# Patient Record
Sex: Male | Born: 1945 | Race: White | Hispanic: No | Marital: Married | Smoking: Former smoker
Health system: Southern US, Community
[De-identification: ages and names within clinical notes are randomized; demographics above are authoritative.]

## PROBLEM LIST (undated history)

## (undated) DIAGNOSIS — Z9889 Other specified postprocedural states: Secondary | ICD-10-CM

## (undated) DIAGNOSIS — E119 Type 2 diabetes mellitus without complications: Secondary | ICD-10-CM

## (undated) DIAGNOSIS — R351 Nocturia: Secondary | ICD-10-CM

## (undated) DIAGNOSIS — M1990 Unspecified osteoarthritis, unspecified site: Secondary | ICD-10-CM

## (undated) DIAGNOSIS — I2510 Atherosclerotic heart disease of native coronary artery without angina pectoris: Secondary | ICD-10-CM

## (undated) DIAGNOSIS — Z85819 Personal history of malignant neoplasm of unspecified site of lip, oral cavity, and pharynx: Secondary | ICD-10-CM

## (undated) DIAGNOSIS — E785 Hyperlipidemia, unspecified: Secondary | ICD-10-CM

## (undated) DIAGNOSIS — E559 Vitamin D deficiency, unspecified: Secondary | ICD-10-CM

## (undated) DIAGNOSIS — T8859XA Other complications of anesthesia, initial encounter: Secondary | ICD-10-CM

## (undated) DIAGNOSIS — T4145XA Adverse effect of unspecified anesthetic, initial encounter: Secondary | ICD-10-CM

## (undated) DIAGNOSIS — C61 Malignant neoplasm of prostate: Secondary | ICD-10-CM

## (undated) DIAGNOSIS — Z85828 Personal history of other malignant neoplasm of skin: Secondary | ICD-10-CM

## (undated) HISTORY — PX: TONSILLECTOMY: SUR1361

## (undated) HISTORY — DX: Type 2 diabetes mellitus without complications: E11.9

## (undated) HISTORY — DX: Vitamin D deficiency, unspecified: E55.9

## (undated) HISTORY — PX: HERNIA REPAIR: SHX51

## (undated) HISTORY — DX: Atherosclerotic heart disease of native coronary artery without angina pectoris: I25.10

## (undated) HISTORY — PX: COLONOSCOPY W/ BIOPSIES AND POLYPECTOMY: SHX1376

---

## 2009-12-22 HISTORY — PX: OTHER SURGICAL HISTORY: SHX169

## 2014-03-15 ENCOUNTER — Other Ambulatory Visit: Payer: Self-pay | Admitting: Neurosurgery

## 2014-03-27 ENCOUNTER — Encounter (HOSPITAL_COMMUNITY): Payer: Self-pay

## 2014-03-27 ENCOUNTER — Encounter (HOSPITAL_COMMUNITY)
Admission: RE | Admit: 2014-03-27 | Discharge: 2014-03-27 | Disposition: A | Discharge status: Home / Self Care | Payer: BC Managed Care – PPO | Source: Ambulatory Visit | Attending: Neurosurgery | Admitting: Neurosurgery

## 2014-03-27 DIAGNOSIS — Z01812 Encounter for preprocedural laboratory examination: Secondary | ICD-10-CM | POA: Insufficient documentation

## 2014-03-27 HISTORY — DX: Hyperlipidemia, unspecified: E78.5

## 2014-03-27 HISTORY — DX: Malignant neoplasm of prostate: C61

## 2014-03-27 HISTORY — DX: Unspecified osteoarthritis, unspecified site: M19.90

## 2014-03-27 HISTORY — DX: Adverse effect of unspecified anesthetic, initial encounter: T41.45XA

## 2014-03-27 HISTORY — DX: Other complications of anesthesia, initial encounter: T88.59XA

## 2014-03-27 LAB — BASIC METABOLIC PANEL
BUN: 15 mg/dL (ref 6–23)
CALCIUM: 9.3 mg/dL (ref 8.4–10.5)
CHLORIDE: 102 meq/L (ref 96–112)
CO2: 25 meq/L (ref 19–32)
Creatinine, Ser: 0.71 mg/dL (ref 0.50–1.35)
GFR calc Af Amer: >90 mL/min (ref >90)
GFR calc non Af Amer: >90 mL/min (ref >90)
Glucose, Bld: 99 mg/dL (ref 70–99)
Potassium: 4.1 mEq/L (ref 3.7–5.3)
SODIUM: 140 meq/L (ref 137–147)

## 2014-03-27 LAB — CBC
HCT: 41.2 % (ref 39.0–52.0)
Hemoglobin: 14.6 g/dL (ref 13.0–17.0)
MCH: 33.3 pg (ref 26.0–34.0)
MCHC: 35.4 g/dL (ref 30.0–36.0)
MCV: 93.8 fL (ref 78.0–100.0)
Platelets: 182 K/uL (ref 150–400)
RBC: 4.39 MIL/uL (ref 4.22–5.81)
RDW: 13.8 % (ref 11.5–15.5)
WBC: 4 K/uL (ref 4.0–10.5)

## 2014-03-27 LAB — SURGICAL PCR SCREEN
MRSA, PCR: NEGATIVE
STAPHYLOCOCCUS AUREUS: NEGATIVE

## 2014-03-27 NOTE — Pre-Procedure Instructions (Signed)
[name]  Your procedure is scheduled on: Wednesday, April 15.  Report to [facility], Main Entrance / Entrance "A" at 6:30AM.  Call this number if you have problems the morning of surgery: [phone]   Remember:   Do not eat food or drink liquids after midnight Tuesday, April 14.   Take these medicines the morning of surgery with A SIP OF WATER: Take if needed:acetaminophen (TYLENOL).               Stop taking Aspirin, Coumadin, Plavix, Effient and Herbal medications.  Do not take any NSAIDs ie: Ibuprofen,  Advil,meloxicam (MOBIC),Naproxen or any medication containing Aspirin.   Do not wear jewelry, make-up or nail polish.  Do not wear lotions, powders, or perfumes. You may wear deodorant.             Men may shave face and neck.  Do not bring valuables to the hospital.  [facility] is not responsible for any belongings or valuables.               Contacts, dentures or bridgework may not be worn into surgery.  Leave suitcase in the car. After surgery it may be brought to your room.  For patients admitted to the hospital, discharge time is determined by your  treatment team.               Patients discharged the day of surgery will not be allowed to drive home.  Name and phone number of your driver: -   Special Instructions: -   Please read over the following fact sheets that you were given: Pain Booklet, Coughing and Deep Breathing and Surgical Site Infection Prevention

## 2014-03-27 NOTE — Pre-Procedure Instructions (Signed)
David Briggs  Your procedure is scheduled on: Thursday, April 16.  Report to [facility], Main Entrance / Entrance "A" at 5:30AM.  Call this number if you have problems the morning of surgery: [phone]   Remember:   Do not eat food or drink liquids after midnight Wednesday, April 15.   Take these medicines the morning of surgery with A SIP OF WATER: Take if needed:acetaminophen (TYLENOL).               Stop taking Aspirin, Coumadin, Plavix, Effient and Herbal medications.  Do not take any NSAIDs ie: Ibuprofen,  Advil,meloxicam (MOBIC),Naproxen or any medication containing Aspirin.   Do not wear jewelry, make-up or nail polish.  Do not wear lotions, powders, or perfumes. You may wear deodorant.             Men may shave face and neck.  Do not bring valuables to the hospital.  [facility] is not responsible for any belongings or valuables.               Contacts, dentures or bridgework may not be worn into surgery.  Leave suitcase in the car. After surgery it may be brought to your room.  For patients admitted to the hospital, discharge time is determined by your  treatment team.               Patients discharged the day of surgery will not be allowed to drive home.  Name and phone number of your driver: -   Special Instructions: -   Please read over the following fact sheets that you were given: Pain Booklet, Coughing and Deep Breathing and Surgical Site Infection Prevention

## 2014-04-04 MED ORDER — CEFAZOLIN SODIUM-DEXTROSE 2-3 GM-% IV SOLR
2.0000 g | INTRAVENOUS | Status: AC
  Administered 2014-04-05: 2 g via INTRAVENOUS
  Filled 2014-04-04: qty 50

## 2014-04-05 ENCOUNTER — Ambulatory Visit (HOSPITAL_COMMUNITY): Payer: BC Managed Care – PPO | Admitting: Anesthesiology

## 2014-04-05 ENCOUNTER — Encounter (HOSPITAL_COMMUNITY)
Admission: RE | Disposition: A | Discharge status: Home / Self Care | Payer: Self-pay | Source: Ambulatory Visit | Attending: Neurosurgery

## 2014-04-05 ENCOUNTER — Observation Stay (HOSPITAL_COMMUNITY)
Admission: RE | Admit: 2014-04-05 | Discharge: 2014-04-06 | Disposition: A | Discharge status: Home / Self Care | Payer: BC Managed Care – PPO | Source: Ambulatory Visit | Attending: Neurosurgery | Admitting: Neurosurgery

## 2014-04-05 ENCOUNTER — Ambulatory Visit (HOSPITAL_COMMUNITY): Payer: BC Managed Care – PPO

## 2014-04-05 ENCOUNTER — Encounter (HOSPITAL_COMMUNITY): Payer: BC Managed Care – PPO | Admitting: Anesthesiology

## 2014-04-05 ENCOUNTER — Encounter (HOSPITAL_COMMUNITY): Payer: Self-pay | Admitting: *Deleted

## 2014-04-05 DIAGNOSIS — Z85819 Personal history of malignant neoplasm of unspecified site of lip, oral cavity, and pharynx: Secondary | ICD-10-CM | POA: Insufficient documentation

## 2014-04-05 DIAGNOSIS — M51379 Other intervertebral disc degeneration, lumbosacral region without mention of lumbar back pain or lower extremity pain: Secondary | ICD-10-CM | POA: Insufficient documentation

## 2014-04-05 DIAGNOSIS — C61 Malignant neoplasm of prostate: Secondary | ICD-10-CM | POA: Insufficient documentation

## 2014-04-05 DIAGNOSIS — E7800 Pure hypercholesterolemia, unspecified: Secondary | ICD-10-CM | POA: Insufficient documentation

## 2014-04-05 DIAGNOSIS — M5137 Other intervertebral disc degeneration, lumbosacral region: Secondary | ICD-10-CM | POA: Insufficient documentation

## 2014-04-05 DIAGNOSIS — M5126 Other intervertebral disc displacement, lumbar region: Principal | ICD-10-CM | POA: Insufficient documentation

## 2014-04-05 DIAGNOSIS — M47817 Spondylosis without myelopathy or radiculopathy, lumbosacral region: Secondary | ICD-10-CM | POA: Insufficient documentation

## 2014-04-05 HISTORY — PX: LUMBAR LAMINECTOMY/DECOMPRESSION MICRODISCECTOMY: SHX5026

## 2014-04-05 SURGERY — LUMBAR LAMINECTOMY/DECOMPRESSION MICRODISCECTOMY 1 LEVEL
Anesthesia: General | Laterality: Right

## 2014-04-05 MED ORDER — ACETAMINOPHEN 325 MG PO TABS: 650.0000 mg | ORAL_TABLET | ORAL | Status: DC | PRN

## 2014-04-05 MED ORDER — SENNA 8.6 MG PO TABS
1.0000 | ORAL_TABLET | Freq: Two times a day (BID) | ORAL | Status: DC
  Administered 2014-04-05: 8.6 mg via ORAL
  Filled 2014-04-05 (×2): qty 1

## 2014-04-05 MED ORDER — FENTANYL CITRATE 0.05 MG/ML IJ SOLN
INTRAMUSCULAR | Status: AC
  Filled 2014-04-05: qty 5

## 2014-04-05 MED ORDER — LIDOCAINE HCL (CARDIAC) 20 MG/ML IV SOLN
INTRAVENOUS | Status: DC | PRN
  Administered 2014-04-05: 40 mg via INTRAVENOUS

## 2014-04-05 MED ORDER — ONDANSETRON HCL 4 MG/2ML IJ SOLN
INTRAMUSCULAR | Status: DC | PRN
  Administered 2014-04-05: 4 mg via INTRAVENOUS

## 2014-04-05 MED ORDER — SIMVASTATIN 40 MG PO TABS
40.0000 mg | ORAL_TABLET | Freq: Every day | ORAL | Status: DC
  Administered 2014-04-05: 40 mg via ORAL
  Filled 2014-04-05 (×2): qty 1

## 2014-04-05 MED ORDER — POLYETHYLENE GLYCOL 3350 17 G PO PACK
17.0000 g | PACK | Freq: Every day | ORAL | Status: DC | PRN
Start: 2014-04-05 — End: 2014-04-06
  Filled 2014-04-05: qty 1

## 2014-04-05 MED ORDER — METHYLPREDNISOLONE ACETATE 80 MG/ML IJ SUSP
INTRAMUSCULAR | Status: DC | PRN
  Administered 2014-04-05: 80 mg

## 2014-04-05 MED ORDER — BUPIVACAINE HCL (PF) 0.5 % IJ SOLN
INTRAMUSCULAR | Status: DC | PRN
  Administered 2014-04-05: 8 mL

## 2014-04-05 MED ORDER — MORPHINE SULFATE 2 MG/ML IJ SOLN
1.0000 mg | INTRAMUSCULAR | Status: DC | PRN
  Administered 2014-04-05: 2 mg via INTRAVENOUS
  Filled 2014-04-05: qty 1

## 2014-04-05 MED ORDER — KCL IN DEXTROSE-NACL 20-5-0.45 MEQ/L-%-% IV SOLN
INTRAVENOUS | Status: DC
  Administered 2014-04-05: 19:00:00 via INTRAVENOUS
  Filled 2014-04-05 (×3): qty 1000

## 2014-04-05 MED ORDER — OXYCODONE-ACETAMINOPHEN 5-325 MG PO TABS: 1.0000 | ORAL_TABLET | ORAL | Status: DC | PRN

## 2014-04-05 MED ORDER — LIDOCAINE-EPINEPHRINE 1 %-1:100000 IJ SOLN
INTRAMUSCULAR | Status: DC | PRN
  Administered 2014-04-05: 8 mL

## 2014-04-05 MED ORDER — PHENOL 1.4 % MT LIQD: 1.0000 | OROMUCOSAL | Status: DC | PRN

## 2014-04-05 MED ORDER — SODIUM CHLORIDE 0.9 % IJ SOLN: 3.0000 mL | INTRAMUSCULAR | Status: DC | PRN

## 2014-04-05 MED ORDER — NEOSTIGMINE METHYLSULFATE 1 MG/ML IJ SOLN
INTRAMUSCULAR | Status: AC
  Filled 2014-04-05: qty 10

## 2014-04-05 MED ORDER — DIAZEPAM 5 MG PO TABS: 5.0000 mg | ORAL_TABLET | Freq: Four times a day (QID) | ORAL | Status: DC | PRN

## 2014-04-05 MED ORDER — PANTOPRAZOLE SODIUM 40 MG IV SOLR
40.0000 mg | Freq: Every day | INTRAVENOUS | Status: DC
  Filled 2014-04-05: qty 40

## 2014-04-05 MED ORDER — EPHEDRINE SULFATE 50 MG/ML IJ SOLN
INTRAMUSCULAR | Status: AC
  Filled 2014-04-05: qty 1

## 2014-04-05 MED ORDER — BISACODYL 10 MG RE SUPP: 10.0000 mg | Freq: Every day | RECTAL | Status: DC | PRN

## 2014-04-05 MED ORDER — FENTANYL CITRATE 0.05 MG/ML IJ SOLN
INTRAMUSCULAR | Status: DC | PRN
Start: 2014-04-05 — End: 2014-04-05
  Administered 2014-04-05: 100 ug via INTRAVENOUS
  Administered 2014-04-05: 50 ug via INTRAVENOUS

## 2014-04-05 MED ORDER — HYDROCODONE-ACETAMINOPHEN 5-325 MG PO TABS
1.0000 | ORAL_TABLET | ORAL | Status: DC | PRN
  Administered 2014-04-05: 1 via ORAL
  Filled 2014-04-05: qty 1

## 2014-04-05 MED ORDER — NEOSTIGMINE METHYLSULFATE 1 MG/ML IJ SOLN
INTRAMUSCULAR | Status: DC | PRN
  Administered 2014-04-05: 3 mg via INTRAVENOUS

## 2014-04-05 MED ORDER — SODIUM CHLORIDE 0.9 % IJ SOLN
INTRAMUSCULAR | Status: AC
  Filled 2014-04-05: qty 10

## 2014-04-05 MED ORDER — SUCCINYLCHOLINE CHLORIDE 20 MG/ML IJ SOLN
INTRAMUSCULAR | Status: AC
  Filled 2014-04-05: qty 1

## 2014-04-05 MED ORDER — ALUM & MAG HYDROXIDE-SIMETH 200-200-20 MG/5ML PO SUSP: 30.0000 mL | Freq: Four times a day (QID) | ORAL | Status: DC | PRN

## 2014-04-05 MED ORDER — ACETAMINOPHEN 650 MG RE SUPP: 650.0000 mg | RECTAL | Status: DC | PRN

## 2014-04-05 MED ORDER — MIDAZOLAM HCL 2 MG/2ML IJ SOLN
INTRAMUSCULAR | Status: AC
  Filled 2014-04-05: qty 2

## 2014-04-05 MED ORDER — GLYCOPYRROLATE 0.2 MG/ML IJ SOLN
INTRAMUSCULAR | Status: AC
  Filled 2014-04-05: qty 2

## 2014-04-05 MED ORDER — ONDANSETRON HCL 4 MG/2ML IJ SOLN: 4.0000 mg | Freq: Once | INTRAMUSCULAR | Status: DC | PRN

## 2014-04-05 MED ORDER — ROCURONIUM BROMIDE 50 MG/5ML IV SOLN
INTRAVENOUS | Status: AC
  Filled 2014-04-05: qty 1

## 2014-04-05 MED ORDER — SODIUM CHLORIDE 0.9 % IJ SOLN
3.0000 mL | Freq: Two times a day (BID) | INTRAMUSCULAR | Status: DC
  Administered 2014-04-05 (×2): 3 mL via INTRAVENOUS

## 2014-04-05 MED ORDER — ONDANSETRON HCL 4 MG/2ML IJ SOLN
INTRAMUSCULAR | Status: AC
  Filled 2014-04-05: qty 2

## 2014-04-05 MED ORDER — DOCUSATE SODIUM 100 MG PO CAPS
100.0000 mg | ORAL_CAPSULE | Freq: Two times a day (BID) | ORAL | Status: DC
  Administered 2014-04-05 (×2): 100 mg via ORAL
  Filled 2014-04-05 (×4): qty 1

## 2014-04-05 MED ORDER — MENTHOL 3 MG MT LOZG
1.0000 | LOZENGE | OROMUCOSAL | Status: DC | PRN
Start: 2014-04-05 — End: 2014-04-06

## 2014-04-05 MED ORDER — THROMBIN 5000 UNITS EX SOLR
CUTANEOUS | Status: DC | PRN
Start: 2014-04-05 — End: 2014-04-05
  Administered 2014-04-05 (×2): 5000 [IU] via TOPICAL

## 2014-04-05 MED ORDER — LIDOCAINE HCL (CARDIAC) 20 MG/ML IV SOLN
INTRAVENOUS | Status: AC
  Filled 2014-04-05: qty 5

## 2014-04-05 MED ORDER — PANTOPRAZOLE SODIUM 40 MG PO TBEC
40.0000 mg | DELAYED_RELEASE_TABLET | Freq: Every day | ORAL | Status: DC
  Administered 2014-04-05: 40 mg via ORAL
  Filled 2014-04-05: qty 1

## 2014-04-05 MED ORDER — 0.9 % SODIUM CHLORIDE (POUR BTL) OPTIME
TOPICAL | Status: DC | PRN
  Administered 2014-04-05: 1000 mL

## 2014-04-05 MED ORDER — LACTATED RINGERS IV SOLN
INTRAVENOUS | Status: DC | PRN
  Administered 2014-04-05 (×2): via INTRAVENOUS

## 2014-04-05 MED ORDER — EPHEDRINE SULFATE 50 MG/ML IJ SOLN
INTRAMUSCULAR | Status: DC | PRN
  Administered 2014-04-05 (×3): 10 mg via INTRAVENOUS
  Administered 2014-04-05: 5 mg via INTRAVENOUS

## 2014-04-05 MED ORDER — PROMETHAZINE HCL 25 MG PO TABS: 25.0000 mg | ORAL_TABLET | Freq: Four times a day (QID) | ORAL | Status: DC | PRN

## 2014-04-05 MED ORDER — HYDROMORPHONE HCL PF 1 MG/ML IJ SOLN
INTRAMUSCULAR | Status: AC
  Filled 2014-04-05: qty 1

## 2014-04-05 MED ORDER — GLYCOPYRROLATE 0.2 MG/ML IJ SOLN
INTRAMUSCULAR | Status: DC | PRN
  Administered 2014-04-05: 0.4 mg via INTRAVENOUS

## 2014-04-05 MED ORDER — PROMETHAZINE HCL 25 MG RE SUPP: 25.0000 mg | Freq: Four times a day (QID) | RECTAL | Status: DC | PRN

## 2014-04-05 MED ORDER — HEMOSTATIC AGENTS (NO CHARGE) OPTIME
TOPICAL | Status: DC | PRN
  Administered 2014-04-05: 1 via TOPICAL

## 2014-04-05 MED ORDER — PROMETHAZINE HCL 25 MG/ML IJ SOLN
12.5000 mg | Freq: Four times a day (QID) | INTRAMUSCULAR | Status: DC | PRN
  Administered 2014-04-05: 12.5 mg via INTRAVENOUS
  Filled 2014-04-05: qty 1

## 2014-04-05 MED ORDER — ONDANSETRON HCL 4 MG/2ML IJ SOLN
4.0000 mg | INTRAMUSCULAR | Status: DC | PRN
  Administered 2014-04-05: 4 mg via INTRAVENOUS
  Filled 2014-04-05: qty 2

## 2014-04-05 MED ORDER — PROPOFOL 10 MG/ML IV BOLUS
INTRAVENOUS | Status: AC
  Filled 2014-04-05: qty 20

## 2014-04-05 MED ORDER — CEFAZOLIN SODIUM 1-5 GM-% IV SOLN
1.0000 g | Freq: Three times a day (TID) | INTRAVENOUS | Status: AC
  Administered 2014-04-05 (×2): 1 g via INTRAVENOUS
  Filled 2014-04-05 (×2): qty 50

## 2014-04-05 MED ORDER — ROCURONIUM BROMIDE 100 MG/10ML IV SOLN
INTRAVENOUS | Status: DC | PRN
  Administered 2014-04-05: 50 mg via INTRAVENOUS

## 2014-04-05 MED ORDER — HYDROMORPHONE HCL PF 1 MG/ML IJ SOLN
0.2500 mg | INTRAMUSCULAR | Status: DC | PRN
  Administered 2014-04-05 (×2): 0.5 mg via INTRAVENOUS

## 2014-04-05 MED ORDER — FENTANYL CITRATE 0.05 MG/ML IJ SOLN
INTRAMUSCULAR | Status: AC
  Filled 2014-04-05: qty 2

## 2014-04-05 MED ORDER — MELOXICAM 7.5 MG PO TABS
7.5000 mg | ORAL_TABLET | Freq: Two times a day (BID) | ORAL | Status: DC
  Administered 2014-04-05 (×2): 7.5 mg via ORAL
  Filled 2014-04-05 (×4): qty 1

## 2014-04-05 MED ORDER — ACETAMINOPHEN 500 MG PO TABS: 500.0000 mg | ORAL_TABLET | Freq: Every day | ORAL | Status: DC | PRN

## 2014-04-05 MED ORDER — FLEET ENEMA 7-19 GM/118ML RE ENEM
1.0000 | ENEMA | Freq: Once | RECTAL | Status: AC | PRN
  Filled 2014-04-05: qty 1

## 2014-04-05 MED ORDER — PROPOFOL 10 MG/ML IV BOLUS
INTRAVENOUS | Status: DC | PRN
  Administered 2014-04-05: 170 mg via INTRAVENOUS

## 2014-04-05 SURGICAL SUPPLY — 62 items
BAG DECANTER FOR FLEXI CONT (MISCELLANEOUS) ×3 IMPLANT
BENZOIN TINCTURE PRP APPL 2/3 (GAUZE/BANDAGES/DRESSINGS) ×3 IMPLANT
BIT DRILL NEURO 2X3.1 SFT TUCH (MISCELLANEOUS) ×1 IMPLANT
BLADE SURG ROTATE 9660 (MISCELLANEOUS) IMPLANT
BUR ROUND FLUTED 5 RND (BURR) ×2 IMPLANT
BUR ROUND FLUTED 5MM RND (BURR) ×1
CANISTER SUCT 3000ML (MISCELLANEOUS) ×3 IMPLANT
CLOSURE WOUND 1/2 X4 (GAUZE/BANDAGES/DRESSINGS)
CONT SPEC 4OZ CLIKSEAL STRL BL (MISCELLANEOUS) ×3 IMPLANT
DERMABOND ADVANCED (GAUZE/BANDAGES/DRESSINGS) ×2
DERMABOND ADVANCED .7 DNX12 (GAUZE/BANDAGES/DRESSINGS) ×1 IMPLANT
DRAPE LAPAROTOMY 100X72X124 (DRAPES) ×3 IMPLANT
DRAPE MICROSCOPE LEICA (MISCELLANEOUS) ×3 IMPLANT
DRAPE POUCH INSTRU U-SHP 10X18 (DRAPES) ×3 IMPLANT
DRAPE SURG 17X23 STRL (DRAPES) ×3 IMPLANT
DRESSING TELFA 8X3 (GAUZE/BANDAGES/DRESSINGS) IMPLANT
DRILL NEURO 2X3.1 SOFT TOUCH (MISCELLANEOUS) ×3
DURAPREP 26ML APPLICATOR (WOUND CARE) ×3 IMPLANT
ELECT REM PT RETURN 9FT ADLT (ELECTROSURGICAL) ×3
ELECTRODE REM PT RTRN 9FT ADLT (ELECTROSURGICAL) ×1 IMPLANT
GAUZE SPONGE 4X4 16PLY XRAY LF (GAUZE/BANDAGES/DRESSINGS) IMPLANT
GLOVE BIO SURGEON STRL SZ8 (GLOVE) ×3 IMPLANT
GLOVE BIOGEL PI IND STRL 8 (GLOVE) ×1 IMPLANT
GLOVE BIOGEL PI IND STRL 8.5 (GLOVE) ×1 IMPLANT
GLOVE BIOGEL PI INDICATOR 8 (GLOVE) ×2
GLOVE BIOGEL PI INDICATOR 8.5 (GLOVE) ×2
GLOVE ECLIPSE 8.0 STRL XLNG CF (GLOVE) ×3 IMPLANT
GLOVE EXAM NITRILE LRG STRL (GLOVE) IMPLANT
GLOVE EXAM NITRILE MD LF STRL (GLOVE) IMPLANT
GLOVE EXAM NITRILE XL STR (GLOVE) IMPLANT
GLOVE EXAM NITRILE XS STR PU (GLOVE) IMPLANT
GLOVE INDICATOR 7.5 STRL GRN (GLOVE) ×3 IMPLANT
GLOVE SS BIOGEL STRL SZ 7 (GLOVE) ×2 IMPLANT
GLOVE SUPERSENSE BIOGEL SZ 7 (GLOVE) ×4
GOWN BRE IMP SLV AUR LG STRL (GOWN DISPOSABLE) IMPLANT
GOWN BRE IMP SLV AUR XL STRL (GOWN DISPOSABLE) IMPLANT
GOWN SPEC L3 XXLG W/TWL (GOWN DISPOSABLE) ×3 IMPLANT
GOWN STRL REIN 2XL LVL4 (GOWN DISPOSABLE) IMPLANT
GOWN STRL REUS W/ TWL LRG LVL3 (GOWN DISPOSABLE) ×2 IMPLANT
GOWN STRL REUS W/ TWL XL LVL3 (GOWN DISPOSABLE) ×1 IMPLANT
GOWN STRL REUS W/TWL LRG LVL3 (GOWN DISPOSABLE) ×4
GOWN STRL REUS W/TWL XL LVL3 (GOWN DISPOSABLE) ×2
KIT BASIN OR (CUSTOM PROCEDURE TRAY) ×3 IMPLANT
KIT ROOM TURNOVER OR (KITS) ×3 IMPLANT
NEEDLE HYPO 18GX1.5 BLUNT FILL (NEEDLE) IMPLANT
NEEDLE HYPO 25X1 1.5 SAFETY (NEEDLE) ×3 IMPLANT
NS IRRIG 1000ML POUR BTL (IV SOLUTION) ×3 IMPLANT
PACK LAMINECTOMY NEURO (CUSTOM PROCEDURE TRAY) ×3 IMPLANT
PAD ARMBOARD 7.5X6 YLW CONV (MISCELLANEOUS) ×9 IMPLANT
RUBBERBAND STERILE (MISCELLANEOUS) ×6 IMPLANT
SPONGE GAUZE 4X4 12PLY (GAUZE/BANDAGES/DRESSINGS) IMPLANT
SPONGE SURGIFOAM ABS GEL SZ50 (HEMOSTASIS) ×3 IMPLANT
STRIP CLOSURE SKIN 1/2X4 (GAUZE/BANDAGES/DRESSINGS) IMPLANT
SUT VIC AB 0 CT1 18XCR BRD8 (SUTURE) ×1 IMPLANT
SUT VIC AB 0 CT1 8-18 (SUTURE) ×2
SUT VIC AB 2-0 CT1 18 (SUTURE) ×3 IMPLANT
SUT VIC AB 3-0 SH 8-18 (SUTURE) ×6 IMPLANT
SYR 20ML ECCENTRIC (SYRINGE) ×3 IMPLANT
SYR 5ML LL (SYRINGE) ×3 IMPLANT
TOWEL OR 17X24 6PK STRL BLUE (TOWEL DISPOSABLE) ×3 IMPLANT
TOWEL OR 17X26 10 PK STRL BLUE (TOWEL DISPOSABLE) ×3 IMPLANT
WATER STERILE IRR 1000ML POUR (IV SOLUTION) ×3 IMPLANT

## 2014-04-05 NOTE — OR Nursing (Signed)
Fentanyl 50 mcg/ml given at surgical site by Dr Vertell Limber

## 2014-04-05 NOTE — Interval H&P Note (Signed)
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 After consideration of risks, benefits and other options for treatment, the patient has consented to  Procedure(s) with comments: Right L1-2 Microdiskectomy (Right) - Right L1-2 Microdiskectomy as a surgical intervention .  The patient's history has been reviewed, patient examined, no change in status, stable for surgery.  I have reviewed the patient's chart and labs.  Questions were answered to the patient's satisfaction.     Erline Levine

## 2014-04-05 NOTE — H&P (Signed)
[address], [place] [address] Phone: [phone]   Patient ID:   [id number] Patient: David Briggs  Date of Birth: 06-Dec-1946 Visit Type: Office Visit   Date: 03/15/2014 09:30 AM Provider: Marchia Meiers. Vertell Limber MD   This 68 year old male presents for back pain and numbness.  History of Present Illness: 1.  back pain  2.  numbness  Elodia Florence, [place].o. male employed at [employer] in [place], visits reporting lumbar pain & right groin to knee numbness since running 01/08/14.  Sitting relieves all symptoms.  ESI by Dr. Elonda Husky helped 70% of pain, but numbness with standing persists.  Dr Elonda Husky did not feel comfortable injecting again until eval by DrStern with review of MRI.  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 New Dx prostate CA made by Bx in January. Treatment plan not final, but pt hoping for prostatectomy.  Mobic 7.5mg  BID for arthritis  Prednisone taper & one month of PT sessions did not help ESI decreased pain "70%"  MRI uploaded to [facility]  She currently describes low back pain at 2 out of 10 but says it has been up to 10 out of 10 when it is most severe.  He also is aware of pain into his thigh and into his buttock on the right.  He says it will go to his knee and he has numbness into his knee and it is quite difficult for him to bear.  Patient is had multiple injections without sustained relief of his leg pain.  He does note that sitting decreases his pain.  He has been out of work since January 20 of this year.        PAST MEDICAL/SURGICAL HISTORY   (Detailed)  Disease/disorder Onset Date Management Date Comments    Hernia repair    Cancer, prostate      Cancer, throat 2011     High cholesterol          PAST MEDICAL HISTORY, SURGICAL HISTORY, FAMILY HISTORY, SOCIAL HISTORY AND REVIEW OF SYSTEMS I have reviewed the patient's past medical, surgical, family and social history as well as the  comprehensive review of systems as included on the [facility] history form dated 03/15/2014, which I have signed.  Family History  (Detailed)  Relationship Family Member Name Deceased Age at Death Condition Onset Age Cause of Death      Family history of Myocardial infarction  N   SOCIAL HISTORY  (Detailed) Tobacco use reviewed. Preferred language is Unknown.   Smoking status: Never smoker.  SMOKING STATUS Use Status Type Smoking Status Usage Per Day Years Used Total Pack Years  no/never  Never smoker       HOME ENVIRONMENT/SAFETY The patient has not fallen in the last year.        MEDICATIONS(added, continued or stopped this visit):   Started Medication Directions Instruction Stopped   aspirin 81 mg tablet,delayed release take 1 tablet by oral route  every day     meloxicam 7.5 mg tablet take 1 tablet by oral route  every 2 days     simvastatin 40 mg tablet take 1 tablet by oral route  every day in the evening      ALLERGIES:  Ingredient Reaction Medication Name Comment  AMOXICILLIN TRIHYDRATE Stomach Pain AUGMENTIN   POTASSIUM CLAVULANATE Stomach Pain AUGMENTIN   MIDAZOLAM HCL Hives Versed   Reviewed, updated.  REVIEW OF SYSTEMS System Neg/Pos  Details  Constitutional Negative Chills, fatigue, fever, malaise, night sweats, weight gain and weight loss.  ENMT Negative Ear drainage, hearing loss, nasal drainage, otalgia, sinus pressure and sore throat.  Eyes Negative Eye discharge, eye pain and vision changes.  Respiratory Negative Chronic cough, cough, dyspnea, known TB exposure and wheezing.  Cardio Negative Chest pain, claudication, edema and irregular heartbeat/palpitations.  GI Negative Abdominal pain, blood in stool, change in stool pattern, constipation, decreased appetite, diarrhea, heartburn, nausea and vomiting.  GU Negative Dribbling, dysuria, erectile dysfunction, hematuria, polyuria, slow stream, urinary frequency, urinary  incontinence and urinary retention.  Endocrine Negative Cold intolerance, heat intolerance, polydipsia and polyphagia.  Neuro Positive Numbness in extremity.  Psych Negative Anxiety, depression and insomnia.  Integumentary Negative Brittle hair, brittle nails, change in shape/size of mole(s), hair loss, hirsutism, hives, pruritus, rash and skin lesion.  MS Positive Back pain.  Hema/Lymph Negative Easy bleeding, easy bruising and lymphadenopathy.  Allergic/Immuno Negative Contact allergy, environmental allergies, food allergies and seasonal allergies.  Reproductive Negative Penile discharge and sexual dysfunction.    Vitals Date Temp F BP Pulse Ht In Wt Lb BMI BSA Pain Score  03/15/2014  110/70 68 70 172 24.68  1/10     PHYSICAL EXAM General Level of Distress: no acute distress Overall Appearance: normal  Head and Face  Right Left  Fundoscopic Exam:  normal normal    Cardiovascular Cardiac: regular rate and rhythm without murmur  Right Left  Carotid Pulses: normal normal  Respiratory Lungs: clear to auscultation  Neurological Orientation: normal Recent and Remote Memory: normal Attention Span and Concentration:   normal Language: normal Fund of Knowledge: normal  Right Left Sensation: normal normal Upper Extremity Coordination: normal normal  Lower Extremity Coordination: normal normal  Musculoskeletal Gait and Station: normal  Right Left Upper Extremity Muscle Strength: normal normal Lower Extremity Muscle Strength: normal normal Upper Extremity Muscle Tone:  normal normal Lower Extremity Muscle Tone: normal normal  Motor Strength Upper and lower extremity motor strength was tested in the clinically pertinent muscles. Any abnormal findings will be noted below.   Right Left Hip Flexor: 4/5    Deep Tendon  Reflexes  Right Left Biceps: normal normal Triceps: normal normal Brachiloradialis: normal normal Patellar: normal normal Achilles: normal normal  Sensory . Any abnormal findings will be noted below.  Right Left L2: decreased    Cranial Nerves II. Optic Nerve/Visual Fields: normal III. Oculomotor: normal IV. Trochlear: normal V. Trigeminal: normal VI. Abducens: normal VII. Facial: normal VIII. Acoustic/Vestibular: normal IX. Glossopharyngeal: normal X. Vagus: normal XI. Spinal Accessory: normal XII. Hypoglossal: normal  Motor and other Tests Lhermittes: negative Rhomberg: negative Pronator drift: absent     Right Left Hoffman's: normal normal Clonus: normal normal Babinski: normal normal SLR: positive at 30 degrees    Additional Findings:  Patient has right sciatic notch discomfort to palpation.  He is able to lean to within 12 inches of the floor with his upper extremities outstretched.  He is able to stand on his heels and toes.    DIAGNOSTIC RESULTS I promoted MRI from [facility] demonstrates a free fragment of herniated disc at the L1-2 level on the right causing right L2 nerve root compression.    IMPRESSION Patient has a symptomatic right L2 radiculopathy with hip flexor weakness and significant pain refractory to injections and conservative management.  I recommended he undergo right L1-2 microdiscectomy.  Assessment/Plan # Detail Type Description   1. 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  2. 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       3. Assessment Lumbago (724.2).       4. Assessment Lumbar radiculopathy (724.4).         Pain Assessment/Treatment Pain Scale: 1/10. Method: Numeric Pain Intensity Scale. Location: back. Onset: 12/22/2013. Duration: varies. Quality: burning, stabbing, aching. Pain Assessment/Treatment follow-up plan of care: Patient currently taking Tylenol as needed for pain..  Fall Risk  Plan The patient has not fallen in the last year.  Surgeries been set up for 04/06/14 at [facility].  Patient is aware of attendant risks and benefits and wishes to proceed.  Orders: Diagnostic Procedures: Assessment Procedure  722.10 Microdiscectomy - right - L1-L2             Provider:  Marchia Meiers. Vertell Limber MD  03/21/2014 11:35 AM Dictation edited by: Marchia Meiers. Vertell Limber    CC Providers: Lance Bosch [place] [address]  ----------------------------------------------------------------------------------------------------------------------------------------------------------------------         Electronically signed by Marchia Meiers. Vertell Limber MD on 03/21/2014 11:35 AM

## 2014-04-05 NOTE — Anesthesia Preprocedure Evaluation (Addendum)
Anesthesia Evaluation  Patient identified by MRN, date of birth, ID band Patient awake    Reviewed: Allergy & Precautions, H&P , NPO status , Patient's Chart, lab work & pertinent test results  Airway Mallampati: II TM Distance: >3 FB Neck ROM: Full    Dental  (+) Teeth Intact, Dental Advisory Given   Pulmonary former smoker,  breath sounds clear to auscultation        Cardiovascular Rhythm:Regular Rate:Normal     Neuro/Psych    GI/Hepatic   Endo/Other    Renal/GU      Musculoskeletal   Abdominal   Peds  Hematology   Anesthesia Other Findings   Reproductive/Obstetrics                          Anesthesia Physical Anesthesia Plan  ASA: II  Anesthesia Plan: General   Post-op Pain Management:    Induction: Intravenous  Airway Management Planned: Oral ETT  Additional Equipment:   Intra-op Plan:   Post-operative Plan: Extubation in OR  Informed Consent: I have reviewed the patients History and Physical, chart, labs and discussed the procedure including the risks, benefits and alternatives for the proposed anesthesia with the patient or authorized representative who has indicated his/her understanding and acceptance.   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

## 2014-04-05 NOTE — Progress Notes (Signed)
Awake, alert, conversant.  Full strength right lower extremity.  Pain improved.  Doing well.

## 2014-04-05 NOTE — Evaluation (Addendum)
Occupational Therapy Evaluation Patient Details Name: David Briggs MRN: [id number] DOB: [birth year] Today's Date: 04/05/2014    History of Present Illness 68 y.o. s/p Right Lumbar one-two Microdiskectomy (Right)   Clinical Impression   Pt moving well during session, but did vomit a few times. Education provided to pt and wife and no further OT needs at this time.     Follow Up Recommendations  No OT follow up;Supervision - Intermittent    Equipment Recommendations  None recommended by OT    Recommendations for Other Services       Precautions / Restrictions Precautions Precautions: Back (more for comfort) Precaution Booklet Issued: Yes (comment) Restrictions Weight Bearing Restrictions: No      Mobility Bed Mobility Overal bed mobility: Modified Independent                Transfers Overall transfer level: Needs assistance Equipment used: None Transfers: Sit to/from Stand Sit to Stand: Modified independent (Device/Increase time);Supervision         General transfer comment: reinforced technique.    Balance                                            ADL Overall ADL's : Needs assistance/impaired     Grooming: Wash/dry Market researcher: Supervision/safety;Ambulation;Regular Toilet (stood at toilet to urinate; sit to stand from bed)   Toileting- Water quality scientist and Hygiene: Supervision/safety (standing)   Tub/ Shower Transfer: Supervision/safety;Ambulation (practiced stepping over)   Functional mobility during ADLs: Supervision/safety (used IV pole some). General ADL Comments: Recommended spouse be with pt for tub transfer. Educated on cup for teeth care and placement of grooming items. Discussed toilet aid if pt would need it for hygiene. Discussed technique for LB dressing/bathing.  Recommended sitting for bathing if pt is going to wash legs/feet.      Vision                      Perception     Praxis      Pertinent Vitals/Pain Reports aching. Increased activity during session.      Hand Dominance Right   Extremity/Trunk Assessment Upper Extremity Assessment Upper Extremity Assessment: Overall WFL for tasks assessed           Communication Communication Communication: No difficulties   Cognition Arousal/Alertness: Awake/alert Behavior During Therapy: WFL for tasks assessed/performed Overall Cognitive Status: Within Functional Limits for tasks assessed                     General Comments       Exercises       Shoulder Instructions      Home Living Family/patient expects to be discharged to:: Private residence Living Arrangements: Spouse/significant other;Other relatives Available Help at Discharge: Family;Available 24 hours/day Type of Home: House Home Access: Stairs to enter [employer] of Steps: 1 Entrance Stairs-Rails: None Home Layout: One level     Bathroom Shower/Tub: Teacher, early years/pre: Standard     Home Equipment: None          Prior Functioning/Environment Level of Independence: Independent             OT Diagnosis:     OT Problem List:     OT Treatment/Interventions:  OT Goals(Current goals can be found in the care plan section)    OT Frequency:     Barriers to D/C:            Co-evaluation              End of Session Equipment Utilized During Treatment: Gait belt Nurse Communication: Other (comment) (vomitted)  Activity Tolerance:  (nausea) Patient left: in bed;with call bell/phone within reach;with family/visitor present   Time: 1440-1509 OT Time Calculation (min): 29 min Charges:  OT General Charges $OT Visit: 1 Procedure OT Evaluation $Initial OT Evaluation Tier I: 1 Procedure OT Treatments $Self Care/Home Management : 8-22 mins G-Codes: OT G-codes **NOT FOR INPATIENT CLASS** Functional Assessment Tool Used: clinical judgment Functional  Limitation: Self care Self Care Current Status (J0300): At least 1 percent but less than 20 percent impaired, limited or restricted Self Care Goal Status (P2330): At least 1 percent but less than 20 percent impaired, limited or restricted Self Care Discharge Status [id number]): At least 1 percent but less than 20 percent impaired, limited or restricted  Benito Mccreedy  OTR/L [phone]  04/05/2014, 3:54 PM

## 2014-04-05 NOTE — Brief Op Note (Signed)
04/05/2014  9:57 AM  PATIENT:  David Briggs  68 y.o. 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  PROCEDURE:  Procedure(s): Right Lumbar one-two Microdiskectomy (Right) with microdissection  SURGEON:  Surgeon(s) and Role:    * Erline Levine, MD - Primary  PHYSICIAN ASSISTANT: Botero  ASSISTANTS: Poteat, RN   ANESTHESIA:   general  EBL:  Total I/O In: 1000 [I.V.:1000] Out: 50 [Blood:50]  BLOOD ADMINISTERED:none  DRAINS: none   LOCAL MEDICATIONS USED:  LIDOCAINE   SPECIMEN:  No Specimen  DISPOSITION OF SPECIMEN:  N/A  COUNTS:  YES  TOURNIQUET:  * No tourniquets in log *  DICTATION: Patient has a free fragment of herniated disc at  L  12 disc on the right with significant right leg weakness. It was elected to take him to surgery for right L 12 microdiscectomy.  Procedure: Patient was brought to the operating room and following the smooth and uncomplicated induction of general endotracheal anesthesia he was placed in a prone position on the Wilson frame. Low back was prepped and draped in the usual sterile fashion with betadine scrub and DuraPrep. Preoperative localizing radiograph was performed which showed needle at L 1 level.  Area of planned incision was infiltrated with local lidocaine. Incision was made in the midline and carried to the lumbodorsal fascia which was incised on the right side of midline. Subperiosteal dissection was performed exposing what was felt to be L 12 level. Intraoperative x-ray demonstrated marker probes at T 12/ L 1 and  Second Xray showed probe at L 12 level.  A hemi-semi-laminectomy of L1 was performed a high-speed drill and completed with Kerrison rongeurs and a generous foraminotomy was performed overlying the  superior aspect of the L 2 lamina. Ligamentum flavum was detached and removed in a piecemeal fashion and the thecal sac was decompressed laterally with removal of the superior aspect of the facet and ligamentum causing nerve root compression. The microscope was brought into the field and the L2 nerve root was mobilized medially. This exposed a fragment of soft disc material and a free fragment of herniated disc material. Multiple fragments were removed  There was evidence on the MRI of cephalad fragments and these were carefully removed and the L1 nerve root was also decompressed. At this point it was felt that all neural elements were well decompressed and there was no evidence of residual loose disc material. The interspace was not disrupted and consequently was not opened. Hemostasis was assured with bipolar electrocautery and the interspace was irrigated with Depo-Medrol and fentanyl. The lumbodorsal fascia was closed with 0 Vicryl sutures the subcutaneous tissues reapproximated 2-0 Vicryl inverted sutures and the skin edges were reapproximated with 3-0 Vicryl subcuticular stitch. The wound is dressed with Dermabond. Patient was extubated in the operating room and taken to recovery in stable and satisfactory condition having tolerated his operation well counts were correct at the end of the case.  PLAN OF CARE: Admit for overnight observation  PATIENT DISPOSITION:  PACU - hemodynamically stable.   Delay start of Pharmacological VTE agent (>24hrs) due to surgical blood loss or risk of bleeding: yes

## 2014-04-05 NOTE — Plan of Care (Signed)
Problem: Consults Goal: Diagnosis - Spinal Surgery Outcome: Completed/Met Date Met:  04/05/14 Microdiscectomy

## 2014-04-05 NOTE — Anesthesia Postprocedure Evaluation (Signed)
  Anesthesia Post-op Note  Patient: David Briggs  Procedure(s) Performed: Procedure(s): Right Lumbar one-two Microdiskectomy (Right)  Patient Location: PACU  Anesthesia Type:General  Level of Consciousness: awake, alert  and oriented  Airway and Oxygen Therapy: Patient Spontanous Breathing and Patient connected to nasal cannula oxygen  Post-op Pain: mild  Post-op Assessment: Post-op Vital signs reviewed, Patient's Cardiovascular Status Stable, Respiratory Function Stable, Patent Airway, No signs of Nausea or vomiting and Pain level controlled  Post-op Vital Signs: stable  Last Vitals:  Filed Vitals:   04/05/14 1315  BP: 100/63  Pulse: 85  Temp: 36.3 C  Resp: 18    Complications: No apparent anesthesia complications

## 2014-04-05 NOTE — Op Note (Signed)
04/05/2014  9:57 AM  PATIENT:  David Briggs  67 y.o. 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  PROCEDURE:  Procedure(s): Right Lumbar one-two Microdiskectomy (Right) with microdissection  SURGEON:  Surgeon(s) and Role:    * Johnathin Vanderschaaf, MD - Primary  PHYSICIAN ASSISTANT: Botero  ASSISTANTS: Poteat, RN   ANESTHESIA:   general  EBL:  Total I/O In: 1000 [I.V.:1000] Out: 50 [Blood:50]  BLOOD ADMINISTERED:none  DRAINS: none   LOCAL MEDICATIONS USED:  LIDOCAINE   SPECIMEN:  No Specimen  DISPOSITION OF SPECIMEN:  N/A  COUNTS:  YES  TOURNIQUET:  * No tourniquets in log *  DICTATION: Patient has a free fragment of herniated disc at  L  12 disc on the right with significant right leg weakness. It was elected to take him to surgery for right L 12 microdiscectomy.  Procedure: Patient was brought to the operating room and following the smooth and uncomplicated induction of general endotracheal anesthesia he was placed in a prone position on the Wilson frame. Low back was prepped and draped in the usual sterile fashion with betadine scrub and DuraPrep. Preoperative localizing radiograph was performed which showed needle at L 1 level.  Area of planned incision was infiltrated with local lidocaine. Incision was made in the midline and carried to the lumbodorsal fascia which was incised on the right side of midline. Subperiosteal dissection was performed exposing what was felt to be L 12 level. Intraoperative x-ray demonstrated marker probes at T 12/ L 1 and  Second Xray showed probe at L 12 level.  A hemi-semi-laminectomy of L1 was performed a high-speed drill and completed with Kerrison rongeurs and a generous foraminotomy was performed overlying the  superior aspect of the L 2 lamina. Ligamentum flavum was detached and removed in a piecemeal fashion and the thecal sac was decompressed laterally with removal of the superior aspect of the facet and ligamentum causing nerve root compression. The microscope was brought into the field and the L2 nerve root was mobilized medially. This exposed a fragment of soft disc material and a free fragment of herniated disc material. Multiple fragments were removed  There was evidence on the MRI of cephalad fragments and these were carefully removed and the L1 nerve root was also decompressed. At this point it was felt that all neural elements were well decompressed and there was no evidence of residual loose disc material. The interspace was not disrupted and consequently was not opened. Hemostasis was assured with bipolar electrocautery and the interspace was irrigated with Depo-Medrol and fentanyl. The lumbodorsal fascia was closed with 0 Vicryl sutures the subcutaneous tissues reapproximated 2-0 Vicryl inverted sutures and the skin edges were reapproximated with 3-0 Vicryl subcuticular stitch. The wound is dressed with Dermabond. Patient was extubated in the operating room and taken to recovery in stable and satisfactory condition having tolerated his operation well counts were correct at the end of the case.  PLAN OF CARE: Admit for overnight observation  PATIENT DISPOSITION:  PACU - hemodynamically stable.   Delay start of Pharmacological VTE agent (>24hrs) due to surgical blood loss or risk of bleeding: yes  

## 2014-04-05 NOTE — Transfer of Care (Signed)
Immediate Anesthesia Transfer of Care Note  Patient: David Briggs  Procedure(s) Performed: Procedure(s): Right Lumbar one-two Microdiskectomy (Right)  Patient Location: PACU  Anesthesia Type:General  Level of Consciousness: awake, alert  and oriented  Airway & Oxygen Therapy: Patient Spontanous Breathing and Patient connected to nasal cannula oxygen  Post-op Assessment: Report given to PACU RN, Post -op Vital signs reviewed and stable and Patient moving all extremities X 4  Post vital signs: Reviewed and stable  Complications: No apparent anesthesia complications

## 2014-04-05 NOTE — Anesthesia Procedure Notes (Signed)
Procedure Name: Intubation Date/Time: 04/05/2014 8:31 AM Performed by: Kyung Rudd Pre-anesthesia Checklist: Patient identified, Emergency Drugs available, Suction available, Patient being monitored and Timeout performed Patient Re-evaluated:Patient Re-evaluated prior to inductionOxygen Delivery Method: Circle system utilized Preoxygenation: Pre-oxygenation with 100% oxygen Intubation Type: IV induction Ventilation: Mask ventilation without difficulty and Oral airway inserted - appropriate to patient size Laryngoscope Size: Mac and 3 Grade View: Grade III Tube type: Oral Tube size: 7.5 mm Number of attempts: 1 Airway Equipment and Method: Stylet Placement Confirmation: ETT inserted through vocal cords under direct vision,  positive ETCO2 and breath sounds checked- equal and bilateral Secured at: 23 cm Tube secured with: Tape Dental Injury: Teeth and Oropharynx as per pre-operative assessment

## 2014-04-06 ENCOUNTER — Encounter (HOSPITAL_COMMUNITY): Payer: Self-pay | Admitting: Neurosurgery

## 2014-04-06 NOTE — Discharge Summary (Signed)
Physician Discharge Summary  Patient ID: David Briggs MRN: [id number] DOB/AGE: 66-Aug-1947 66 y.o.  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  Following uncomplicated microdiscectomy, he recovered nicely and transferred to  3500 for observation.  Consults: None  Significant Diagnostic Studies: radiology: X-Ray: intra-operative  Treatments: surgery: Right Lumbar one-two Microdiskectomy (Right) with microdissection   Discharge Exam: Blood pressure 101/65, pulse 72, temperature 99.6 F (37.6 C), temperature source Oral, resp. rate 18, weight 77.111 kg (170 lb), SpO2 97.00%. Alert, conversant, walking in room. Incisionwithout erythema, swelling , or drainage beneath Dermabond & honeycomb drsg. Good strength BLE.    Disposition: Discharge to home. Pt verbalizes understanding of d/c instructions & has f/u appt with DrStern. Rx's to chart for Norco & Robaxin.      Medication List    ASK your doctor about these medications       acetaminophen 500 MG tablet  Commonly known as:  TYLENOL  Take 500 mg by mouth daily as needed for moderate pain.     meloxicam 7.5 MG tablet  Commonly known as:  MOBIC  Take 7.5 mg by mouth 2 (two) times daily.     simvastatin 40 MG tablet  Commonly known as:  ZOCOR  Take 40 mg by mouth daily at 6 PM.         Signed: Verdis Prime 04/06/2014, 10:48 AM

## 2014-04-06 NOTE — Progress Notes (Signed)
Pt doing well. Pt and wife given D/C instructions with Rx's, verbal understanding of teaching was given. Pt's IV was removed prior to D/C. Pt D/C'd home via wheelchair @ 1115 per MD order. Pt is stable @ D/C and has no other needs. Holli Humbles, RN

## 2014-04-06 NOTE — Progress Notes (Signed)
Subjective: Patient reports "The leg feels pretty good."  Objective: Vital signs in last 24 hours: Temp:  [97.4 F (36.3 C)-99.6 F (37.6 C)] 99.6 F (37.6 C) (04/16 0818) Pulse Rate:  [64-85] 72 (04/16 0818) Resp:  [11-20] 18 (04/16 0818) BP: (91-122)/(52-80) 101/65 mmHg (04/16 0818) SpO2:  [92 %-100 %] 97 % (04/16 0818)  Intake/Output from previous day: 04/15 0701 - 04/16 0700 In: 2060 [P.O.:960; I.V.:1100] Out: 600 [Urine:500; Blood:100] Intake/Output this shift:    Alert, conversant, walking in room. Incisionwithout erythema, swelling , or drainage beneath Dermabond & honeycomb drsg. Good strength BLE.  Lab Results: No results found for this basename: WBC, HGB, HCT, PLT,  in the last 72 hours BMET No results found for this basename: NA, K, CL, CO2, GLUCOSE, BUN, CREATININE, CALCIUM,  in the last 72 hours  Studies/Results: Dg Lumbar Spine 2-3 Views  04/05/2014   CLINICAL DATA:  Right L1-2 microdiscectomy.  EXAM: LUMBAR SPINE - 2-3 VIEW  COMPARISON:  None.  FINDINGS: 3 interoperative cross-table lateral views of the lumbar spine are submitted. The first image, labeled #1, shows a surgical instrument tip in the soft tissues posterior to the L1 vertebral body. Endplate degenerative changes and loss of disc space height are seen in the mid and lower lumbar spine.  The second image, labeled #2, shows a surgical instrument tip projecting posterior to the L1 vertebral body, over the T12-L1 facet joints.  The last image, labeled #3, shows a surgical instrument tip projecting posterior to the superior aspect of the L2 vertebral body.  IMPRESSION: Intraoperative localization at L1-2.   Electronically Signed   By: Lorin Picket M.D.   On: 04/05/2014 10:19    Assessment/Plan: Improved   LOS: 1 day  Per DrStern, d/c IV, d/c to home. Pt verbalizes understanding of d/c instructions & has f/u appt with DrStern. Rx's to chart for Norco & Robaxin.   Aaron Edelman Donnell Beauchamp 04/06/2014, 10:44  AM

## 2014-05-24 ENCOUNTER — Other Ambulatory Visit: Payer: Self-pay | Admitting: Urology

## 2014-06-19 ENCOUNTER — Encounter (HOSPITAL_COMMUNITY): Payer: Self-pay | Admitting: Pharmacy Technician

## 2014-06-29 ENCOUNTER — Encounter (HOSPITAL_COMMUNITY): Payer: Self-pay

## 2014-06-29 ENCOUNTER — Ambulatory Visit (HOSPITAL_COMMUNITY)
Admission: RE | Admit: 2014-06-29 | Discharge: 2014-06-29 | Disposition: A | Discharge status: Home / Self Care | Payer: BC Managed Care – PPO | Source: Ambulatory Visit | Attending: Urology | Admitting: Urology

## 2014-06-29 ENCOUNTER — Encounter (HOSPITAL_COMMUNITY)
Admission: RE | Admit: 2014-06-29 | Discharge: 2014-06-29 | Disposition: A | Discharge status: Home / Self Care | Payer: BC Managed Care – PPO | Source: Ambulatory Visit | Attending: Urology | Admitting: Urology

## 2014-06-29 ENCOUNTER — Encounter (INDEPENDENT_AMBULATORY_CARE_PROVIDER_SITE_OTHER): Payer: Self-pay

## 2014-06-29 ENCOUNTER — Other Ambulatory Visit: Payer: Self-pay

## 2014-06-29 DIAGNOSIS — Z01810 Encounter for preprocedural cardiovascular examination: Secondary | ICD-10-CM | POA: Insufficient documentation

## 2014-06-29 DIAGNOSIS — Z01818 Encounter for other preprocedural examination: Secondary | ICD-10-CM | POA: Insufficient documentation

## 2014-06-29 DIAGNOSIS — C61 Malignant neoplasm of prostate: Secondary | ICD-10-CM | POA: Insufficient documentation

## 2014-06-29 DIAGNOSIS — Z01812 Encounter for preprocedural laboratory examination: Secondary | ICD-10-CM | POA: Insufficient documentation

## 2014-06-29 DIAGNOSIS — Z87891 Personal history of nicotine dependence: Secondary | ICD-10-CM | POA: Insufficient documentation

## 2014-06-29 HISTORY — DX: Personal history of malignant neoplasm of unspecified site of lip, oral cavity, and pharynx: Z85.819

## 2014-06-29 HISTORY — DX: Nocturia: R35.1

## 2014-06-29 HISTORY — DX: Personal history of other malignant neoplasm of skin: Z85.828

## 2014-06-29 LAB — BASIC METABOLIC PANEL
Anion gap: 9 (ref 5–15)
BUN: 17 mg/dL (ref 6–23)
CHLORIDE: 101 meq/L (ref 96–112)
CO2: 30 mEq/L (ref 19–32)
Calcium: 9.9 mg/dL (ref 8.4–10.5)
Creatinine, Ser: 0.84 mg/dL (ref 0.50–1.35)
GFR calc non Af Amer: 90 mL/min — ABNORMAL LOW (ref >90)
Glucose, Bld: 152 mg/dL — ABNORMAL HIGH (ref 70–99)
POTASSIUM: 4.1 meq/L (ref 3.7–5.3)
Sodium: 140 mEq/L (ref 137–147)

## 2014-06-29 LAB — CBC
HCT: 44.2 % (ref 39.0–52.0)
HEMOGLOBIN: 15.8 g/dL (ref 13.0–17.0)
MCH: 32.7 pg (ref 26.0–34.0)
MCHC: 35.7 g/dL (ref 30.0–36.0)
MCV: 91.5 fL (ref 78.0–100.0)
PLATELETS: 206 10*3/uL (ref 150–400)
RBC: 4.83 MIL/uL (ref 4.22–5.81)
RDW: 12 % (ref 11.5–15.5)
WBC: 4.4 10*3/uL (ref 4.0–10.5)

## 2014-06-29 NOTE — Patient Instructions (Addendum)
David Briggs  06/29/2014                           YOUR PROCEDURE IS SCHEDULED ON: 07/03/14 AT 11:00 AM               ENTER [facility] ENTRANCE AND                            FOLLOW  SIGNS TO SHORT STAY CENTER                 ARRIVE AT SHORT STAY AT: 9:00 AM               CALL THIS NUMBER IF ANY PROBLEMS THE DAY OF SURGERY :               [phone]                                REMEMBER:   Do not eat food or drink liquids AFTER MIDNIGHT              Take these medicines the morning of surgery with               A SIPS OF WATER :    NONE     Do not wear jewelry, make-up   Do not wear lotions, powders, or perfumes.   Do not shave legs or underarms 12 hrs. before surgery (men may shave face)  Do not bring valuables to the hospital.  Contacts, dentures or bridgework may not be worn into surgery.  Leave suitcase in the car. After surgery it may be brought to your room.  For patients admitted to the hospital more than one night, checkout time is            11:00 AM                                                          ________________________________________________________________________                                                                     David Briggs  Before surgery, you can play an important role.  Because skin is not sterile, your skin needs to be as free of germs as possible.  You can reduce the number of germs on your skin by washing with CHG (chlorahexidine gluconate) soap before surgery.  CHG is an antiseptic cleaner which kills germs and bonds with the skin to continue killing germs even after washing. Please DO NOT use if you have an allergy to CHG or antibacterial soaps.  If your skin becomes reddened/irritated stop using the CHG and inform your nurse when you arrive at Short Stay. Do not shave (including legs and underarms) for at least 48 hours prior to the first CHG shower.  You may shave your  face. Please follow  these instructions carefully:   1.  Shower with CHG Soap the night before surgery and the  morning of Surgery.   2.  If you choose to wash your hair, wash your hair first as usual with your  normal  Shampoo.   3.  After you shampoo, rinse your hair and body thoroughly to remove the  shampoo.                                         4.  Use CHG as you would any other liquid soap.  You can apply chg directly  to the skin and wash . Gently wash with scrungie or clean wascloth    5.  Apply the CHG Soap to your body ONLY FROM THE NECK DOWN.   Do not use on open                           Wound or open sores. Avoid contact with eyes, ears mouth and genitals (private parts).                        Genitals (private parts) with your normal soap.              6.  Wash thoroughly, paying special attention to the area where your surgery  will be performed.   7.  Thoroughly rinse your body with warm water from the neck down.   8.  DO NOT shower/wash with your normal soap after using and rinsing off  the CHG Soap .                9.  Pat yourself dry with a clean towel.             10.  Wear clean pajamas.             11.  Place clean sheets on your bed the night of your first shower and do not  sleep with pets.  Day of Surgery : Do not apply any lotions/deodorants the morning of surgery.  Please wear clean clothes to the hospital/surgery center.  FAILURE TO FOLLOW THESE INSTRUCTIONS MAY RESULT IN THE CANCELLATION OF YOUR SURGERY    PATIENT SIGNATURE_________________________________  ______________________________________________________________________   David Briggs  An incentive spirometer is a tool that can help keep your lungs clear and active. This tool measures how well you are filling your lungs with each breath. Taking long deep breaths may help reverse or decrease the chance of developing breathing (pulmonary) problems (especially infection)  following:  A long period of time when you are unable to move or be active. BEFORE THE PROCEDURE   If the spirometer includes an indicator to show your best effort, your nurse or respiratory therapist will set it to a desired goal.  If possible, sit up straight or lean slightly forward. Try not to slouch.  Hold the incentive spirometer in an upright position. INSTRUCTIONS FOR USE  1. Sit on the edge of your bed if possible, or sit up as far as you can in bed or on a chair. 2. Hold the incentive spirometer in an upright position. 3. Breathe out normally. 4. Place the mouthpiece in your mouth and seal your lips tightly around it. 5. Breathe in slowly and as deeply as possible, raising the piston or the ball  toward the top of the column. 6. Hold your breath for 3-5 seconds or for as long as possible. Allow the piston or ball to fall to the bottom of the column. 7. Remove the mouthpiece from your mouth and breathe out normally. 8. Rest for a few seconds and repeat Steps 1 through 7 at least 10 times every 1-2 hours when you are awake. Take your time and take a few normal breaths between deep breaths. 9. The spirometer may include an indicator to show your best effort. Use the indicator as a goal to work toward during each repetition. 10. After each set of 10 deep breaths, practice coughing to be sure your lungs are clear. If you have an incision (the cut made at the time of surgery), support your incision when coughing by placing a pillow or rolled up towels firmly against it. Once you are able to get out of bed, walk around indoors and cough well. You may stop using the incentive spirometer when instructed by your caregiver.  RISKS AND COMPLICATIONS  Take your time so you do not get dizzy or light-headed.  If you are in pain, you may need to take or ask for pain medication before doing incentive spirometry. It is harder to take a deep breath if you are having pain. AFTER USE  Rest and  breathe slowly and easily.  It can be helpful to keep track of a log of your progress. Your caregiver can provide you with a simple table to help with this. If you are using the spirometer at home, follow these instructions: David Briggs IF:   You are having difficultly using the spirometer.  You have trouble using the spirometer as often as instructed.  Your pain medication is not giving enough relief while using the spirometer.  You develop fever of 100.5 F (38.1 C) or higher. SEEK IMMEDIATE MEDICAL CARE IF:   You cough up bloody sputum that had not been present before.  You develop fever of 102 F (38.9 C) or greater.  You develop worsening pain at or near the incision site. MAKE SURE YOU:   Understand these instructions.  Will watch your condition.  Will get help right away if you are not doing well or get worse. Document Released: 04/20/2007 Document Revised: 03/01/2012 Document Reviewed: 06/21/2007 [employer] Patient Information 2014 [employer], [place].   ________________________________________________________________________     WHAT IS A BLOOD TRANSFUSION? Blood Transfusion Information  A transfusion is the replacement of blood or some of its parts. Blood is made up of multiple cells which provide different functions.  Red blood cells carry oxygen and are used for blood loss replacement.  White blood cells fight against infection.  Platelets control bleeding.  Plasma helps clot blood.  Other blood products are available for specialized needs, such as hemophilia or other clotting disorders. BEFORE THE TRANSFUSION  Who gives blood for transfusions?   Healthy volunteers who are fully evaluated to make sure their blood is safe. This is blood bank blood. Transfusion therapy is the safest it has ever been in the practice of medicine. Before blood is taken from a donor, a complete history is taken to make sure that person has no history of diseases nor  engages in risky social behavior (examples are intravenous drug use or sexual activity with multiple partners). The donor's travel history is screened to minimize risk of transmitting infections, such as malaria. The donated blood is tested for signs of infectious diseases, such as HIV and hepatitis.  The blood is then tested to be sure it is compatible with you in order to minimize the chance of a transfusion reaction. If you or a relative donates blood, this is often done in anticipation of surgery and is not appropriate for emergency situations. It takes many days to process the donated blood. RISKS AND COMPLICATIONS Although transfusion therapy is very safe and saves many lives, the main dangers of transfusion include:   Getting an infectious disease.  Developing a transfusion reaction. This is an allergic reaction to something in the blood you were given. Every precaution is taken to prevent this. The decision to have a blood transfusion has been considered carefully by your caregiver before blood is given. Blood is not given unless the benefits outweigh the risks. AFTER THE TRANSFUSION  Right after receiving a blood transfusion, you will usually feel much better and more energetic. This is especially true if your red blood cells have gotten low (anemic). The transfusion raises the level of the red blood cells which carry oxygen, and this usually causes an energy increase.  The nurse administering the transfusion will monitor you carefully for complications. HOME CARE INSTRUCTIONS  No special instructions are needed after a transfusion. You may find your energy is better. Speak with your caregiver about any limitations on activity for underlying diseases you may have. SEEK MEDICAL CARE IF:   Your condition is not improving after your transfusion.  You develop redness or irritation at the intravenous (IV) site. SEEK IMMEDIATE MEDICAL CARE IF:  Any of the following symptoms occur over the next  12 hours:  Shaking chills.  You have a temperature by mouth above 102 F (38.9 C), not controlled by medicine.  Chest, back, or muscle pain.  People around you feel you are not acting correctly or are confused.  Shortness of breath or difficulty breathing.  Dizziness and fainting.  You get a rash or develop hives.  You have a decrease in urine output.  Your urine turns a dark color or changes to pink, red, or brown. Any of the following symptoms occur over the next 10 days:  You have a temperature by mouth above 102 F (38.9 C), not controlled by medicine.  Shortness of breath.  Weakness after normal activity.  The white part of the eye turns yellow (jaundice).  You have a decrease in the amount of urine or are urinating less often.  Your urine turns a dark color or changes to pink, red, or brown. Document Released: 12/05/2000 Document Revised: 03/01/2012 Document Reviewed: 07/24/2008 [facility] Patient Information 2014 [place], [place].  _______________________________________________________________________

## 2014-06-30 NOTE — H&P (Signed)
Chief Complaint Prostate Cancer     History of Present Illness [name] is a 68 year old gentleman who had a PET scan on 12/28/13 for follow up surveillance of his history of squamous cell carcinoma of the tonsil. While this did not demonstrate any evidence for recurrent squamous cell of the head and neck origin, it did demonstrate hypermetabolic activity at the left lateral prostate. He underwent urologic evaluation by Dr. Verna Czech and was noted to have nodularity of the left side of the prostate. His PSA was 2.6 which is increased from approximately 1.6 year before. 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 A repeat PSA was apparently performed on 04/26/14 and had further increased to 3.87. He has been counseled by Dr. Will Bonnet and sought a second opinion by Dr. Titus Mould in [place], [place]. He is sent today to discuss treatment options and specifically to consider a robotic prostatectomy.    His past medical history is significant for squamous cell carcinoma of the tonsil. He was treated with surgical resection in June 2011 followed by adjuvant radiation therapy and has been disease-free since. He is followed by Dr. Francene Finders in [place].    TNM stage: cT2b Nx Mx (left base)  PSA: 3.87  Gleason score: 3+3=6  Biopsy (01/26/14, read by Dr. Joya Martyr, [facility], Acc # [id number]): 4/12 cores -- Left 4/6 cores positive (3+3=6, 25%, 40%, PNI)  Prostate volume: Vol 18 cc    Urinary function: He denies significant lower urinary tract symptoms. IPSS is 7.  Erectile function: He has severe erectile dysfunction. SHIM score is 4.       Past Medical History Problems  1. History of arthritis (V13.4) 2. History of hyperlipidemia (V12.29) 3. History of malignant neoplasm of tonsil (V10.02) 4. History of prostate cancer (V10.46)  Surgical History Problems   1. History of Back Surgery 2. History of Hernia Repair 3. History of Tonsillectomy With Adenoidectomy  He has undergone bilateral inguinal hernia repairs with mesh.   Current Meds 1. Aspirin 81 MG Oral Tablet;  Therapy: (Recorded:02Jun2015) to Recorded 2. Mobic 15 MG Oral Tablet (Meloxicam);  Therapy: (Recorded:02Jun2015) to Recorded 3. Zocor 40 MG Oral Tablet (Simvastatin);  Therapy: (Recorded:02Jun2015) to Recorded  Allergies Medication  1. Amoxicillin TABS 2. Versed SOLN  Family History Problems  1. Family history of Deceased : Mother, Father 2. Family history of lung cancer (V16.1) : Father  Social History Problems    Denied: History of Alcohol use   Former smoker [place])   smoked about a pack a day x 25 years; quit smoking in 2005   Married   Occupation   works at [employer], constitutional, skin, eye, otolaryngeal, hematologic/lymphatic, cardiovascular, pulmonary, endocrine, musculoskeletal, gastrointestinal, neurological and psychiatric system(s) were reviewed and pertinent findings if present are noted.  Musculoskeletal: back pain.    Vitals [id number] 09:13AM  Height: 5 ft 9 in  Weight: 170 lb  BMI Calculated: 25.1  BSA Calculated: 1.93   Physical Exam Constitutional: Well nourished and well developed . No acute distress.   ENT:. The ears and nose are normal in appearance.   Neck: The appearance of the neck is normal and no neck mass is present.   Pulmonary: No respiratory distress, normal respiratory rhythm and effort and clear bilateral breath sounds.   Cardiovascular: Heart rate and rhythm are normal . No peripheral edema.  Abdomen: The abdomen is soft and nontender. No masses are palpated. No CVA tenderness. No hernias are palpable. No hepatosplenomegaly noted.     Discussion/Summary 1. Prostate cancer: He has elected to proceed with surgical treatment and will be undergo a right nerve sparing  robotic-assisted laparoscopic radical prostatectomy and bilateral pelvic lymphadenectomy.

## 2014-07-03 ENCOUNTER — Encounter (HOSPITAL_COMMUNITY): Payer: BC Managed Care – PPO | Admitting: Anesthesiology

## 2014-07-03 ENCOUNTER — Encounter (HOSPITAL_COMMUNITY)
Admission: RE | Disposition: A | Discharge status: Home / Self Care | Payer: Self-pay | Source: Ambulatory Visit | Attending: Urology

## 2014-07-03 ENCOUNTER — Inpatient Hospital Stay (HOSPITAL_COMMUNITY): Payer: BC Managed Care – PPO | Admitting: Anesthesiology

## 2014-07-03 ENCOUNTER — Inpatient Hospital Stay (HOSPITAL_COMMUNITY)
Admission: RE | Admit: 2014-07-03 | Discharge: 2014-07-04 | DRG: 708 | Disposition: A | Discharge status: Home / Self Care | Payer: BC Managed Care – PPO | Source: Ambulatory Visit | Attending: Urology | Admitting: Urology

## 2014-07-03 ENCOUNTER — Encounter (HOSPITAL_COMMUNITY): Payer: Self-pay | Admitting: *Deleted

## 2014-07-03 DIAGNOSIS — Z79899 Other long term (current) drug therapy: Secondary | ICD-10-CM

## 2014-07-03 DIAGNOSIS — M129 Arthropathy, unspecified: Secondary | ICD-10-CM | POA: Diagnosis present

## 2014-07-03 DIAGNOSIS — E785 Hyperlipidemia, unspecified: Secondary | ICD-10-CM | POA: Diagnosis present

## 2014-07-03 DIAGNOSIS — Z880 Allergy status to penicillin: Secondary | ICD-10-CM

## 2014-07-03 DIAGNOSIS — Z801 Family history of malignant neoplasm of trachea, bronchus and lung: Secondary | ICD-10-CM

## 2014-07-03 DIAGNOSIS — C61 Malignant neoplasm of prostate: Secondary | ICD-10-CM | POA: Diagnosis present

## 2014-07-03 DIAGNOSIS — Z85819 Personal history of malignant neoplasm of unspecified site of lip, oral cavity, and pharynx: Secondary | ICD-10-CM

## 2014-07-03 DIAGNOSIS — Z7982 Long term (current) use of aspirin: Secondary | ICD-10-CM

## 2014-07-03 DIAGNOSIS — Z87891 Personal history of nicotine dependence: Secondary | ICD-10-CM

## 2014-07-03 DIAGNOSIS — N529 Male erectile dysfunction, unspecified: Secondary | ICD-10-CM | POA: Diagnosis present

## 2014-07-03 DIAGNOSIS — Z888 Allergy status to other drugs, medicaments and biological substances status: Secondary | ICD-10-CM

## 2014-07-03 HISTORY — PX: ROBOT ASSISTED LAPAROSCOPIC RADICAL PROSTATECTOMY: SHX5141

## 2014-07-03 HISTORY — PX: LYMPHADENECTOMY: SHX5960

## 2014-07-03 LAB — HEMOGLOBIN AND HEMATOCRIT, BLOOD
HCT: 38.9 % — ABNORMAL LOW (ref 39.0–52.0)
HEMOGLOBIN: 13.8 g/dL (ref 13.0–17.0)

## 2014-07-03 LAB — TYPE AND SCREEN
ABO/RH(D): A NEG
Antibody Screen: NEGATIVE

## 2014-07-03 LAB — ABO/RH: ABO/RH(D): A NEG

## 2014-07-03 SURGERY — ROBOTIC ASSISTED LAPAROSCOPIC RADICAL PROSTATECTOMY LEVEL 2
Anesthesia: General

## 2014-07-03 MED ORDER — NEOSTIGMINE METHYLSULFATE 10 MG/10ML IV SOLN
INTRAVENOUS | Status: DC | PRN
  Administered 2014-07-03: 4 mg via INTRAVENOUS

## 2014-07-03 MED ORDER — HEPARIN SODIUM (PORCINE) 1000 UNIT/ML IJ SOLN
INTRAMUSCULAR | Status: AC
  Filled 2014-07-03: qty 1

## 2014-07-03 MED ORDER — BUPIVACAINE-EPINEPHRINE (PF) 0.25% -1:200000 IJ SOLN
INTRAMUSCULAR | Status: AC
Start: 2014-07-03 — End: 2014-07-03
  Filled 2014-07-03: qty 30

## 2014-07-03 MED ORDER — DOCUSATE SODIUM 100 MG PO CAPS
100.0000 mg | ORAL_CAPSULE | Freq: Two times a day (BID) | ORAL | Status: DC
  Administered 2014-07-03 – 2014-07-04 (×2): 100 mg via ORAL
  Filled 2014-07-03 (×3): qty 1

## 2014-07-03 MED ORDER — PROPOFOL 10 MG/ML IV BOLUS
INTRAVENOUS | Status: AC
  Filled 2014-07-03: qty 20

## 2014-07-03 MED ORDER — ONDANSETRON HCL 4 MG/2ML IJ SOLN
INTRAMUSCULAR | Status: AC
  Filled 2014-07-03: qty 2

## 2014-07-03 MED ORDER — PROPOFOL 10 MG/ML IV BOLUS
INTRAVENOUS | Status: DC | PRN
  Administered 2014-07-03: 180 mg via INTRAVENOUS

## 2014-07-03 MED ORDER — DEXAMETHASONE SODIUM PHOSPHATE 10 MG/ML IJ SOLN
INTRAMUSCULAR | Status: DC | PRN
  Administered 2014-07-03: 10 mg via INTRAVENOUS

## 2014-07-03 MED ORDER — PROMETHAZINE HCL 25 MG/ML IJ SOLN: 6.2500 mg | INTRAMUSCULAR | Status: DC | PRN

## 2014-07-03 MED ORDER — ROCURONIUM BROMIDE 100 MG/10ML IV SOLN
INTRAVENOUS | Status: AC
  Filled 2014-07-03: qty 1

## 2014-07-03 MED ORDER — GLYCOPYRROLATE 0.2 MG/ML IJ SOLN
INTRAMUSCULAR | Status: DC | PRN
  Administered 2014-07-03: .6 mg via INTRAVENOUS

## 2014-07-03 MED ORDER — VANCOMYCIN HCL IN DEXTROSE 1-5 GM/200ML-% IV SOLN
1000.0000 mg | Freq: Two times a day (BID) | INTRAVENOUS | Status: AC
  Administered 2014-07-03: 1000 mg via INTRAVENOUS
  Filled 2014-07-03 (×2): qty 200

## 2014-07-03 MED ORDER — HYDROCODONE-ACETAMINOPHEN 5-325 MG PO TABS: 1.0000 | ORAL_TABLET | Freq: Four times a day (QID) | ORAL | Status: DC | PRN

## 2014-07-03 MED ORDER — HYDROMORPHONE HCL PF 1 MG/ML IJ SOLN
INTRAMUSCULAR | Status: AC
  Filled 2014-07-03: qty 1

## 2014-07-03 MED ORDER — LIDOCAINE HCL (CARDIAC) 20 MG/ML IV SOLN
INTRAVENOUS | Status: AC
  Filled 2014-07-03: qty 5

## 2014-07-03 MED ORDER — GLYCOPYRROLATE 0.2 MG/ML IJ SOLN
INTRAMUSCULAR | Status: AC
  Filled 2014-07-03: qty 3

## 2014-07-03 MED ORDER — HYDROMORPHONE HCL PF 1 MG/ML IJ SOLN
0.2500 mg | INTRAMUSCULAR | Status: DC | PRN
  Administered 2014-07-03 (×5): 0.5 mg via INTRAVENOUS

## 2014-07-03 MED ORDER — GABAPENTIN 100 MG PO CAPS
100.0000 mg | ORAL_CAPSULE | Freq: Three times a day (TID) | ORAL | Status: DC | PRN
  Filled 2014-07-03: qty 1

## 2014-07-03 MED ORDER — DEXAMETHASONE SODIUM PHOSPHATE 10 MG/ML IJ SOLN
INTRAMUSCULAR | Status: AC
  Filled 2014-07-03: qty 1

## 2014-07-03 MED ORDER — DIPHENHYDRAMINE HCL 12.5 MG/5ML PO ELIX: 12.5000 mg | ORAL_SOLUTION | Freq: Four times a day (QID) | ORAL | Status: DC | PRN

## 2014-07-03 MED ORDER — ACETAMINOPHEN 325 MG PO TABS: 650.0000 mg | ORAL_TABLET | ORAL | Status: DC | PRN

## 2014-07-03 MED ORDER — NEOSTIGMINE METHYLSULFATE 10 MG/10ML IV SOLN
INTRAVENOUS | Status: AC
  Filled 2014-07-03: qty 1

## 2014-07-03 MED ORDER — DIPHENHYDRAMINE HCL 50 MG/ML IJ SOLN: 12.5000 mg | Freq: Four times a day (QID) | INTRAMUSCULAR | Status: DC | PRN

## 2014-07-03 MED ORDER — SODIUM CHLORIDE 0.9 % IV BOLUS (SEPSIS)
1000.0000 mL | Freq: Once | INTRAVENOUS | Status: AC
  Administered 2014-07-03: 1000 mL via INTRAVENOUS

## 2014-07-03 MED ORDER — KETOROLAC TROMETHAMINE 15 MG/ML IJ SOLN
15.0000 mg | Freq: Four times a day (QID) | INTRAMUSCULAR | Status: DC
Start: 2014-07-03 — End: 2014-07-04
  Administered 2014-07-03 – 2014-07-04 (×4): 15 mg via INTRAVENOUS
  Filled 2014-07-03 (×6): qty 1

## 2014-07-03 MED ORDER — KCL IN DEXTROSE-NACL 20-5-0.45 MEQ/L-%-% IV SOLN
INTRAVENOUS | Status: DC
  Administered 2014-07-03: 22:00:00 via INTRAVENOUS
  Administered 2014-07-03: 1000 mL via INTRAVENOUS
  Administered 2014-07-04: 06:00:00 via INTRAVENOUS
  Filled 2014-07-03 (×4): qty 1000

## 2014-07-03 MED ORDER — ROCURONIUM BROMIDE 100 MG/10ML IV SOLN
INTRAVENOUS | Status: DC | PRN
  Administered 2014-07-03 (×2): 10 mg via INTRAVENOUS
  Administered 2014-07-03: 50 mg via INTRAVENOUS
  Administered 2014-07-03: 10 mg via INTRAVENOUS

## 2014-07-03 MED ORDER — SIMVASTATIN 40 MG PO TABS
40.0000 mg | ORAL_TABLET | Freq: Every day | ORAL | Status: DC
  Administered 2014-07-03: 40 mg via ORAL
  Filled 2014-07-03 (×2): qty 1

## 2014-07-03 MED ORDER — KCL IN DEXTROSE-NACL 20-5-0.9 MEQ/L-%-% IV SOLN
INTRAVENOUS | Status: AC
  Filled 2014-07-03: qty 1000

## 2014-07-03 MED ORDER — FENTANYL CITRATE 0.05 MG/ML IJ SOLN
INTRAMUSCULAR | Status: DC | PRN
Start: 2014-07-03 — End: 2014-07-03
  Administered 2014-07-03: 100 ug via INTRAVENOUS
  Administered 2014-07-03 (×3): 50 ug via INTRAVENOUS

## 2014-07-03 MED ORDER — MORPHINE SULFATE 2 MG/ML IJ SOLN
2.0000 mg | INTRAMUSCULAR | Status: DC | PRN
  Administered 2014-07-03: 2 mg via INTRAVENOUS
  Filled 2014-07-03: qty 1

## 2014-07-03 MED ORDER — VANCOMYCIN HCL IN DEXTROSE 1-5 GM/200ML-% IV SOLN
INTRAVENOUS | Status: AC
Start: 2014-07-03 — End: 2014-07-03
  Filled 2014-07-03: qty 200

## 2014-07-03 MED ORDER — SODIUM CHLORIDE 0.9 % IR SOLN
Status: DC | PRN
  Administered 2014-07-03: 1000 mL

## 2014-07-03 MED ORDER — FENTANYL CITRATE 0.05 MG/ML IJ SOLN
INTRAMUSCULAR | Status: AC
  Filled 2014-07-03: qty 5

## 2014-07-03 MED ORDER — LACTATED RINGERS IV SOLN
INTRAVENOUS | Status: DC | PRN
  Administered 2014-07-03: 12:00:00

## 2014-07-03 MED ORDER — BUPIVACAINE-EPINEPHRINE 0.25% -1:200000 IJ SOLN
INTRAMUSCULAR | Status: DC | PRN
  Administered 2014-07-03: 30 mL

## 2014-07-03 MED ORDER — CIPROFLOXACIN HCL 500 MG PO TABS: 500.0000 mg | ORAL_TABLET | Freq: Two times a day (BID) | ORAL | Status: DC

## 2014-07-03 MED ORDER — STERILE WATER FOR IRRIGATION IR SOLN
Status: DC | PRN
  Administered 2014-07-03: 3000 mL

## 2014-07-03 MED ORDER — ONDANSETRON HCL 4 MG/2ML IJ SOLN
INTRAMUSCULAR | Status: DC | PRN
  Administered 2014-07-03: 4 mg via INTRAVENOUS

## 2014-07-03 MED ORDER — LACTATED RINGERS IV SOLN
INTRAVENOUS | Status: DC | PRN
  Administered 2014-07-03 (×2): via INTRAVENOUS

## 2014-07-03 MED ORDER — MIDAZOLAM HCL 2 MG/2ML IJ SOLN
INTRAMUSCULAR | Status: AC
  Filled 2014-07-03: qty 2

## 2014-07-03 MED ORDER — ONDANSETRON HCL 4 MG/2ML IJ SOLN
4.0000 mg | INTRAMUSCULAR | Status: DC | PRN
  Administered 2014-07-03: 4 mg via INTRAVENOUS
  Filled 2014-07-03: qty 2

## 2014-07-03 MED ORDER — LACTATED RINGERS IV SOLN
INTRAVENOUS | Status: DC
  Administered 2014-07-03: 1000 mL via INTRAVENOUS

## 2014-07-03 MED ORDER — LIDOCAINE HCL (CARDIAC) 20 MG/ML IV SOLN
INTRAVENOUS | Status: DC | PRN
  Administered 2014-07-03: 100 mg via INTRAVENOUS

## 2014-07-03 MED ORDER — VANCOMYCIN HCL IN DEXTROSE 1-5 GM/200ML-% IV SOLN
1000.0000 mg | INTRAVENOUS | Status: AC
  Administered 2014-07-03: 1000 mg via INTRAVENOUS

## 2014-07-03 SURGICAL SUPPLY — 42 items
CABLE HIGH FREQUENCY MONO STRZ (ELECTRODE) ×4 IMPLANT
CATH FOLEY 2WAY SLVR 18FR 30CC (CATHETERS) ×4 IMPLANT
CATH ROBINSON RED A/P 16FR (CATHETERS) ×4 IMPLANT
CATH ROBINSON RED A/P 8FR (CATHETERS) ×4 IMPLANT
CATH TIEMANN FOLEY 18FR 5CC (CATHETERS) ×4 IMPLANT
CHLORAPREP W/TINT 26ML (MISCELLANEOUS) ×4 IMPLANT
CLIP LIGATING HEM O LOK PURPLE (MISCELLANEOUS) ×8 IMPLANT
CLOTH BEACON ORANGE TIMEOUT ST (SAFETY) ×4 IMPLANT
COVER SURGICAL LIGHT HANDLE (MISCELLANEOUS) ×4 IMPLANT
COVER TIP SHEARS 8 DVNC (MISCELLANEOUS) ×2 IMPLANT
COVER TIP SHEARS 8MM DA VINCI (MISCELLANEOUS) ×2
CUTTER ECHEON FLEX ENDO 45 340 (ENDOMECHANICALS) ×4 IMPLANT
DECANTER SPIKE VIAL GLASS SM (MISCELLANEOUS) IMPLANT
DERMABOND ADVANCED (GAUZE/BANDAGES/DRESSINGS) ×2
DERMABOND ADVANCED .7 DNX12 (GAUZE/BANDAGES/DRESSINGS) ×2 IMPLANT
DRAPE SURG IRRIG POUCH 19X23 (DRAPES) ×4 IMPLANT
DRSG TEGADERM 4X4.75 (GAUZE/BANDAGES/DRESSINGS) ×4 IMPLANT
DRSG TEGADERM 6X8 (GAUZE/BANDAGES/DRESSINGS) ×8 IMPLANT
ELECT REM PT RETURN 9FT ADLT (ELECTROSURGICAL) ×4
ELECTRODE REM PT RTRN 9FT ADLT (ELECTROSURGICAL) ×2 IMPLANT
GLOVE BIO SURGEON STRL SZ 6.5 (GLOVE) ×3 IMPLANT
GLOVE BIO SURGEONS STRL SZ 6.5 (GLOVE) ×1
GLOVE BIOGEL M STRL SZ7.5 (GLOVE) ×64 IMPLANT
GOWN STRL REUS W/TWL LRG LVL3 (GOWN DISPOSABLE) ×36 IMPLANT
HOLDER FOLEY CATH W/STRAP (MISCELLANEOUS) ×4 IMPLANT
IV LACTATED RINGERS 1000ML (IV SOLUTION) IMPLANT
KIT ACCESSORY DA VINCI DISP (KITS) ×2
KIT ACCESSORY DVNC DISP (KITS) ×2 IMPLANT
MANIFOLD NEPTUNE II (INSTRUMENTS) ×4 IMPLANT
NDL SAFETY ECLIPSE 18X1.5 (NEEDLE) ×2 IMPLANT
NEEDLE HYPO 18GX1.5 SHARP (NEEDLE) ×2
PACK ROBOT UROLOGY CUSTOM (CUSTOM PROCEDURE TRAY) ×4 IMPLANT
RELOAD GREEN ECHELON 45 (STAPLE) ×4 IMPLANT
SET TUBE IRRIG SUCTION NO TIP (IRRIGATION / IRRIGATOR) ×4 IMPLANT
SOLUTION ELECTROLUBE (MISCELLANEOUS) ×4 IMPLANT
SUT ETHILON 3 0 PS 1 (SUTURE) ×4 IMPLANT
SUT MNCRL AB 4-0 PS2 18 (SUTURE) ×8 IMPLANT
SUT VICRYL 0 UR6 27IN ABS (SUTURE) ×8 IMPLANT
SYRINGE 1CC 27X.5 TB SAFETYGLD (MISCELLANEOUS) ×4 IMPLANT
TOWEL OR 17X26 10 PK STRL BLUE (TOWEL DISPOSABLE) ×4 IMPLANT
TOWEL OR NON WOVEN STRL DISP B (DISPOSABLE) ×4 IMPLANT
WATER STERILE IRR 1500ML POUR (IV SOLUTION) ×4 IMPLANT

## 2014-07-03 NOTE — Anesthesia Preprocedure Evaluation (Signed)
Anesthesia Evaluation  Patient identified by MRN, date of birth, ID band Patient awake  General Assessment Comment:H/O squamous cell Ca L tonsil 2011, S/P excision and XRT    Reviewed: Allergy & Precautions, H&P , NPO status , Patient's Chart, lab work & pertinent test results  Airway Mallampati: II TM Distance: <3 FB Neck ROM: Full    Dental no notable dental hx.    Pulmonary neg pulmonary ROS, former smoker,  breath sounds clear to auscultation  Pulmonary exam normal       Cardiovascular negative cardio ROS  Rhythm:Regular Rate:Normal     Neuro/Psych negative neurological ROS  negative psych ROS   GI/Hepatic negative GI ROS, Neg liver ROS,   Endo/Other  negative endocrine ROS  Renal/GU negative Renal ROS  negative genitourinary   Musculoskeletal negative musculoskeletal ROS (+)   Abdominal   Peds negative pediatric ROS (+)  Hematology negative hematology ROS (+)   Anesthesia Other Findings   Reproductive/Obstetrics negative OB ROS                           Anesthesia Physical Anesthesia Plan  ASA: II  Anesthesia Plan: General   Post-op Pain Management:    Induction: Intravenous  Airway Management Planned: Oral ETT  Additional Equipment:   Intra-op Plan:   Post-operative Plan: Extubation in OR  Informed Consent: I have reviewed the patients History and Physical, chart, labs and discussed the procedure including the risks, benefits and alternatives for the proposed anesthesia with the patient or authorized representative who has indicated his/her understanding and acceptance.   Dental advisory given  Plan Discussed with: CRNA and Surgeon  Anesthesia Plan Comments:         Anesthesia Quick Evaluation

## 2014-07-03 NOTE — Progress Notes (Signed)
Patient ID: David Briggs, male   DOB: October 19, 1946, 68 y.o.   MRN: [id number]  Post-op note  Subjective: The patient is doing well.  No complaints.  Objective: Vital signs in last 24 hours: Temp:  [97.2 F (36.2 C)-97.9 F (36.6 C)] 97.2 F (36.2 C) (07/13 1559) Pulse Rate:  [62-73] 73 (07/13 1559) Resp:  [13-20] 16 (07/13 1559) BP: (96-125)/(57-76) 106/64 mmHg (07/13 1559) SpO2:  [96 %-100 %] 97 % (07/13 1559) Weight:  [80 kg (176 lb 5.9 oz)-80.003 kg (176 lb 6 oz)] 80 kg (176 lb 5.9 oz) (07/13 1559)  Intake/Output from previous day:   Intake/Output this shift:    Physical Exam:  General: Alert and oriented. Abdomen: Soft, Nondistended. Incisions: Clean and dry. GU: Urine clear.  Lab Results:  Recent Labs  07/03/14 1456  HGB 13.8  HCT 38.9*    Assessment/Plan: POD#0   1) Continue to monitor   David Briggs. MD   LOS: 0 days   David Briggs,David Briggs 07/03/2014, 9:20 PM

## 2014-07-03 NOTE — Op Note (Signed)
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 Robotic assisted laparoscopic radical prostatectomy (right nerve sparing) 2. Bilateral robotic assisted laparoscopic pelvic lymphadenectomy  Surgeon: Pryor Curia. M.D.  Assistant(s): Leta Baptist, PA-C and Park Liter, MD  Anesthesia: General  Complications: None  EBL: 75 mL  IVF:  1600 mL crystalloid  Specimens: 1. Prostate and seminal vesicles 2. Right pelvic lymph nodes 3. Left pelvic lymph nodes  Disposition of specimens: Pathology  Drains: 1. 20 Fr coude catheter 2. # 19 Blake pelvic drain  Indication: David Briggs is a 68 y.o. patient with clinically localized prostate cancer.  After a thorough review of the management options for treatment of prostate cancer, he elected to proceed with surgical therapy and the above procedure(s).  We have discussed the potential benefits and risks of the procedure, side effects of the proposed treatment, the likelihood of the patient achieving the goals of the procedure, and any potential problems that might occur during the procedure or recuperation. Informed consent has been obtained.  Description of procedure:  The patient was taken to the operating room and a general anesthetic was administered. He was given preoperative antibiotics, placed in the dorsal lithotomy position, and prepped and draped in the usual sterile fashion. Next a preoperative timeout was performed. A urethral catheter was placed into the bladder and a site was selected near the umbilicus for placement of the camera port. This was placed using a standard open Hassan technique which allowed entry into the peritoneal cavity under direct vision and without difficulty. A 12 mm port was placed and a pneumoperitoneum established. The  camera was then used to inspect the abdomen and there was no evidence of any intra-abdominal injuries or other abnormalities. The remaining abdominal ports were then placed. 8 mm robotic ports were placed in the right lower quadrant, left lower quadrant, and far left lateral abdominal wall. A 5 mm port was placed in the right upper quadrant and a 12 mm port was placed in the right lateral abdominal wall for laparoscopic assistance. All ports were placed under direct vision without difficulty. The surgical cart was then docked.   Utilizing the cautery scissors, the bladder was reflected posteriorly allowing entry into the space of Retzius and identification of the endopelvic fascia and prostate. The periprostatic fat was then removed from the prostate allowing full exposure of the endopelvic fascia. The endopelvic fascia was then incised from the apex back to the base of the prostate bilaterally and the underlying levator muscle fibers were swept laterally off the prostate thereby isolating the dorsal venous complex. The dorsal vein was then stapled and divided with a 45 mm Flex Echelon stapler. Attention then turned to the bladder neck which was divided anteriorly thereby allowing entry into the bladder and exposure of the urethral catheter. The catheter balloon was deflated and the catheter was brought into the operative field and used to retract the prostate anteriorly. The posterior bladder neck was then examined and was divided allowing further dissection between the bladder and prostate posteriorly until the vasa deferentia and seminal vessels were identified. The vasa deferentia were isolated, divided, and lifted anteriorly. The seminal vesicles were dissected down to their tips with care to control the seminal vascular arterial blood supply. These structures were then lifted anteriorly and the space between Denonvillier's fascia and the anterior rectum was developed with a  combination of sharp and blunt  dissection. This isolated the vascular pedicles of the prostate.  The lateral prostatic fascia on the right side of the prostate was then sharply incised allowing release of the neurovascular bundle. The vascular pedicle of the prostate on the right side was then ligated with Weck clips between the prostate and neurovascular bundle and divided with sharp cold scissor dissection resulting in neurovascular bundle preservation. On the left side, a wide non nerve sparing dissection was performed with Weck clips used to ligate the vascular pedicle of the prostate. The neurovascular bundle on the right side was then separated off the apex of the prostate and urethra.  The urethra was then sharply transected allowing the prostate specimen to be disarticulated. The pelvis was copiously irrigated and hemostasis was ensured. There was no evidence for rectal injury.  Attention then turned to the right pelvic sidewall. The fibrofatty tissue between the external iliac vein, confluence of the iliac vessels, hypogastric artery, and Cooper's ligament was dissected free from the pelvic sidewall with care to preserve the obturator nerve. Weck clips were used for lymphostasis and hemostasis. An identical procedure was performed on the contralateral side and the lymphatic packets were removed for permanent pathologic analysis.  Attention then turned to the urethral anastomosis. A 2-0 Vicryl slip knot was placed between Denonvillier's fascia, the posterior bladder neck, and the posterior urethra to reapproximate these structures. A double-armed 3-0 Monocryl suture was then used to perform a 360 running tension-free anastomosis between the bladder neck and urethra. A new urethral catheter was then placed into the bladder and irrigated. There were no blood clots within the bladder and the anastomosis appeared to be watertight. A #19 Blake drain was then brought through the left lateral 8 mm port site and positioned appropriately  within the pelvis. It was secured to the skin with a nylon suture. The surgical cart was then undocked. The right lateral 12 mm port site was closed at the fascial level with a 0 Vicryl suture placed laparoscopically. All remaining ports were then removed under direct vision. The prostate specimen was removed intact within the Endopouch retrieval bag via the periumbilical camera port site. This fascial opening was closed with two running 0 Vicryl sutures. 0.25% Marcaine was then injected into all port sites and all incisions were reapproximated at the skin level with 4-0 Monocryl subcuticular sutures. Dermabond was applied. The patient appeared to tolerate the procedure well and without complications. The patient was able to be extubated and transferred to the recovery unit in satisfactory condition.   Pryor Curia MD

## 2014-07-03 NOTE — Discharge Instructions (Signed)
1. Activity:  You are encouraged to ambulate frequently (about every hour during waking hours) to help prevent blood clots from forming in your legs or lungs.  However, you should not engage in any heavy lifting (> 10-15 lbs), strenuous activity, or straining. 2. Diet: You should continue a clear liquid diet until passing gas from below.  Once this occurs, you may advance your diet to a soft diet that would be easy to digest (i.e soups, scrambled eggs, mashed potatoes, etc.) for 24 hours just as you would if getting over a bad stomach flu.  If tolerating this diet well for 24 hours, you may then begin eating regular food.  It will be normal to have some amount of bloating, nausea, and abdominal discomfort intermittently. 3. Prescriptions:  You will be provided a prescription for pain medication to take as needed.  If your pain is not severe enough to require the prescription pain medication, you may take Tylenol instead.  You should also take an over the counter stool softener (Colace 100 mg twice daily) to avoid straining with bowel movements as the pain medication may constipate you. Finally, you will also be provided a prescription for an antibiotic to begin the day prior to your return visit in the office for catheter removal. 4. Catheter care: You will be taught how to take care of the catheter by the nursing staff prior to discharge from the hospital.  You may use both a leg bag and the larger bedside bag but it is recommended to at least use the bigger bedside bag at nighttime as the leg bag is small and will fill up overnight and also does not drain as well when lying flat. You may periodically feel a strong urge to void with the catheter in place.  This is a bladder spasm and most often can occur when having a bowel movement or when you are moving around. It is typically self-limited and usually will stop after a few minutes.  You may use some Vaseline or Neosporin around the tip of the catheter to  reduce friction at the tip of the penis. 5. Incisions: You may remove your dressing bandages the 2nd day after surgery.  You most likely will have a few small staples in each of the incisions and once the bandages are removed, the incisions may stay open to air.  You may start showering (not soaking or bathing in water) 48 hours after surgery and the incisions simply need to be patted dry after the shower.  No additional care is needed. 6. What to call us about: You should call the office [phone]) if you develop fever > 101, persistent vomiting, or the catheter stops draining. Also, feel free to call with any other questions you may have and remember the handout that was provided to you as a reference preoperatively which answers many of the common questions that arise after surgery. 7. You may resume aspirin, advil, aleve, mobic, vitamins, and supplements 7 days after surgery.

## 2014-07-03 NOTE — Anesthesia Postprocedure Evaluation (Signed)
  Anesthesia Post-op Note  Patient: David Briggs  Procedure(s) Performed: Procedure(s) (LRB): ROBOTIC ASSISTED LAPAROSCOPIC RADICAL PROSTATECTOMY LEVEL 2 (N/A) LYMPHADENECTOMY (Bilateral)  Patient Location: PACU  Anesthesia Type: General  Level of Consciousness: awake and alert   Airway and Oxygen Therapy: Patient Spontanous Breathing  Post-op Pain: mild  Post-op Assessment: Post-op Vital signs reviewed, Patient's Cardiovascular Status Stable, Respiratory Function Stable, Patent Airway and No signs of Nausea or vomiting  Last Vitals:  Filed Vitals:   07/03/14 1500  BP: 99/57  Pulse: 62  Temp:   Resp: 17    Post-op Vital Signs: stable   Complications: No apparent anesthesia complications

## 2014-07-03 NOTE — Transfer of Care (Signed)
Immediate Anesthesia Transfer of Care Note  Patient: David Briggs  Procedure(s) Performed: Procedure(s): ROBOTIC ASSISTED LAPAROSCOPIC RADICAL PROSTATECTOMY LEVEL 2 (N/A) LYMPHADENECTOMY (Bilateral)  Patient Location: PACU  Anesthesia Type:General  Level of Consciousness: awake, alert  and oriented  Airway & Oxygen Therapy: Patient Spontanous Breathing and Patient connected to face mask oxygen  Post-op Assessment: Report given to PACU RN and Post -op Vital signs reviewed and stable  Post vital signs: Reviewed and stable  Complications: No apparent anesthesia complications

## 2014-07-04 ENCOUNTER — Encounter (HOSPITAL_COMMUNITY): Payer: Self-pay | Admitting: Urology

## 2014-07-04 LAB — HEMOGLOBIN AND HEMATOCRIT, BLOOD
HEMATOCRIT: 38.2 % — AB (ref 39.0–52.0)
Hemoglobin: 13.4 g/dL (ref 13.0–17.0)

## 2014-07-04 MED ORDER — BISACODYL 10 MG RE SUPP
10.0000 mg | Freq: Once | RECTAL | Status: AC
  Administered 2014-07-04: 10 mg via RECTAL
  Filled 2014-07-04: qty 1

## 2014-07-04 MED ORDER — HYDROCODONE-ACETAMINOPHEN 5-325 MG PO TABS
1.0000 | ORAL_TABLET | Freq: Four times a day (QID) | ORAL | Status: DC | PRN
  Administered 2014-07-04: 1 via ORAL
  Filled 2014-07-04: qty 1

## 2014-07-04 NOTE — Progress Notes (Signed)
Patient ID: David Briggs, male   DOB: 11/18/1946, 68 y.o.   MRN: [id number]  1 Day Post-Op Subjective: The patient is doing well.  No nausea or vomiting. Pain is adequately controlled.  Objective: Vital signs in last 24 hours: Temp:  [97.2 F (36.2 C)-98.7 F (37.1 C)] 98.7 F (37.1 C) (07/14 0551) Pulse Rate:  [62-82] 74 (07/14 0551) Resp:  [13-20] 16 (07/14 0551) BP: (94-125)/(55-76) 94/55 mmHg (07/14 0551) SpO2:  [96 %-100 %] 97 % (07/14 0551) Weight:  [80 kg (176 lb 5.9 oz)-80.003 kg (176 lb 6 oz)] 80 kg (176 lb 5.9 oz) (07/13 1559)  Intake/Output from previous day: 07/13 0701 - 07/14 0700 In: 4137.5 [I.V.:3137.5; IV Piggyback:1000] Out: 2130 [Urine:1900; Drains:155; Blood:75] Intake/Output this shift:    Physical Exam:  General: Alert and oriented. CV: RRR Lungs: Clear bilaterally. GI: Soft, Nondistended. Incisions: Clean, dry, and intact Urine: Clear Extremities: Nontender, no erythema, no edema.  Lab Results:  Recent Labs  07/03/14 1456 07/04/14 0420  HGB 13.8 13.4  HCT 38.9* 38.2*      Assessment/Plan: POD# 1 s/p robotic prostatectomy.  1) SL IVF 2) Ambulate, Incentive spirometry 3) Transition to oral pain medication 4) Dulcolax suppository 5) D/C pelvic drain 6) Plan for likely discharge later today   Pryor Curia. MD   LOS: 1 day   [name],LES 07/04/2014, 7:19 AM

## 2014-07-04 NOTE — Care Management Note (Signed)
    Page 1 of 1   07/04/2014     3:07:14 PM CARE MANAGEMENT NOTE 07/04/2014  Patient:  David Briggs,David Briggs   Account Number:  [id number]  Date Initiated:  07/04/2014  Documentation initiated by:  Dessa Phi  Subjective/Objective Assessment:   [address] [place]     Action/Plan:   FROM HOME.   Anticipated DC Date:  07/04/2014   Anticipated DC Plan:  HOME/SELF CARE         Choice offered to / List presented to:             Status of service:  Completed, signed off Medicare Important Message given?   (If response is "NO", the following Medicare IM given date fields will be blank) Date Medicare IM given:   Medicare IM given by:   Date Additional Medicare IM given:   Additional Medicare IM given by:    Discharge Disposition:  HOME/SELF CARE  Per UR Regulation:  Reviewed for med. necessity/level of care/duration of stay  If discussed at [place] of Stay Meetings, dates discussed:    Comments:  07/04/14 Wadell Craddock RN,BSN NCM [phone] D/C HOME NO [place].

## 2014-07-04 NOTE — Discharge Summary (Signed)
  Date of admission: 07/03/2014  Date of discharge: 07/04/2014  Admission diagnosis: Prostate Cancer  Discharge diagnosis: Prostate Cancer  History and Physical: For full details, please see admission history and physical. Briefly, David Briggs is a 68 y.o. gentleman with localized prostate cancer.  After discussing management/treatment options, he elected to proceed with surgical treatment.  Hospital Course: David Briggs was taken to the operating room on 07/03/2014 and underwent a robotic assisted laparoscopic radical prostatectomy. He tolerated this procedure well and without complications. Postoperatively, he was able to be transferred to a regular hospital room following recovery from anesthesia.  He was able to begin ambulating the night of surgery. He remained hemodynamically stable overnight.  He had excellent urine output with appropriately minimal output from his pelvic drain and his pelvic drain was removed on POD #1.  He was transitioned to oral pain medication, tolerated a clear liquid diet, and had met all discharge criteria and was able to be discharged home later on POD#1.  Laboratory values:  Recent Labs  07/03/14 1456 07/04/14 0420  HGB 13.8 13.4  HCT 38.9* 38.2*    Disposition: Home  Discharge instruction: He was instructed to be ambulatory but to refrain from heavy lifting, strenuous activity, or driving. He was instructed on urethral catheter care.  Discharge medications:     Medication List    STOP taking these medications       acetaminophen 500 MG tablet  Commonly known as:  TYLENOL     meloxicam 7.5 MG tablet  Commonly known as:  MOBIC      TAKE these medications       ciprofloxacin 500 MG tablet  Commonly known as:  CIPRO  Take 1 tablet (500 mg total) by mouth 2 (two) times daily. Start day prior to office visit for foley removal     gabapentin 100 MG capsule  Commonly known as:  NEURONTIN  Take 100 mg by mouth 3 (three) times daily as needed  (nerve pain).     HYDROcodone-acetaminophen 5-325 MG per tablet  Commonly known as:  NORCO  Take 1-2 tablets by mouth every 6 (six) hours as needed.     simvastatin 40 MG tablet  Commonly known as:  ZOCOR  Take 40 mg by mouth daily at 6 PM.        Followup: He will followup in 1 week for catheter removal and to discuss his surgical pathology results.

## 2014-07-04 NOTE — Progress Notes (Signed)
Patient has ambulated in hallway several times and tolerated well. Will continue to monitor patient.Setzer, Marchelle Folks

## 2014-07-04 NOTE — Progress Notes (Signed)
Patient and wife taught how to use leg bag. Patient had been to class and was able to verbalize and demonstrate understanding. Wife able to as well. Supplies given to patient.Will discharge per order. Setzer, Marchelle Folks

## 2015-04-12 IMAGING — CR DG CHEST 2V
2 series · 2 of 2 positions shown · non-contrast
Comparison: None.

CLINICAL DATA: Preop prostatectomy. Prostate cancer. Remote history
of smoking.

EXAM:
CHEST  2 VIEW

[w chest pa]
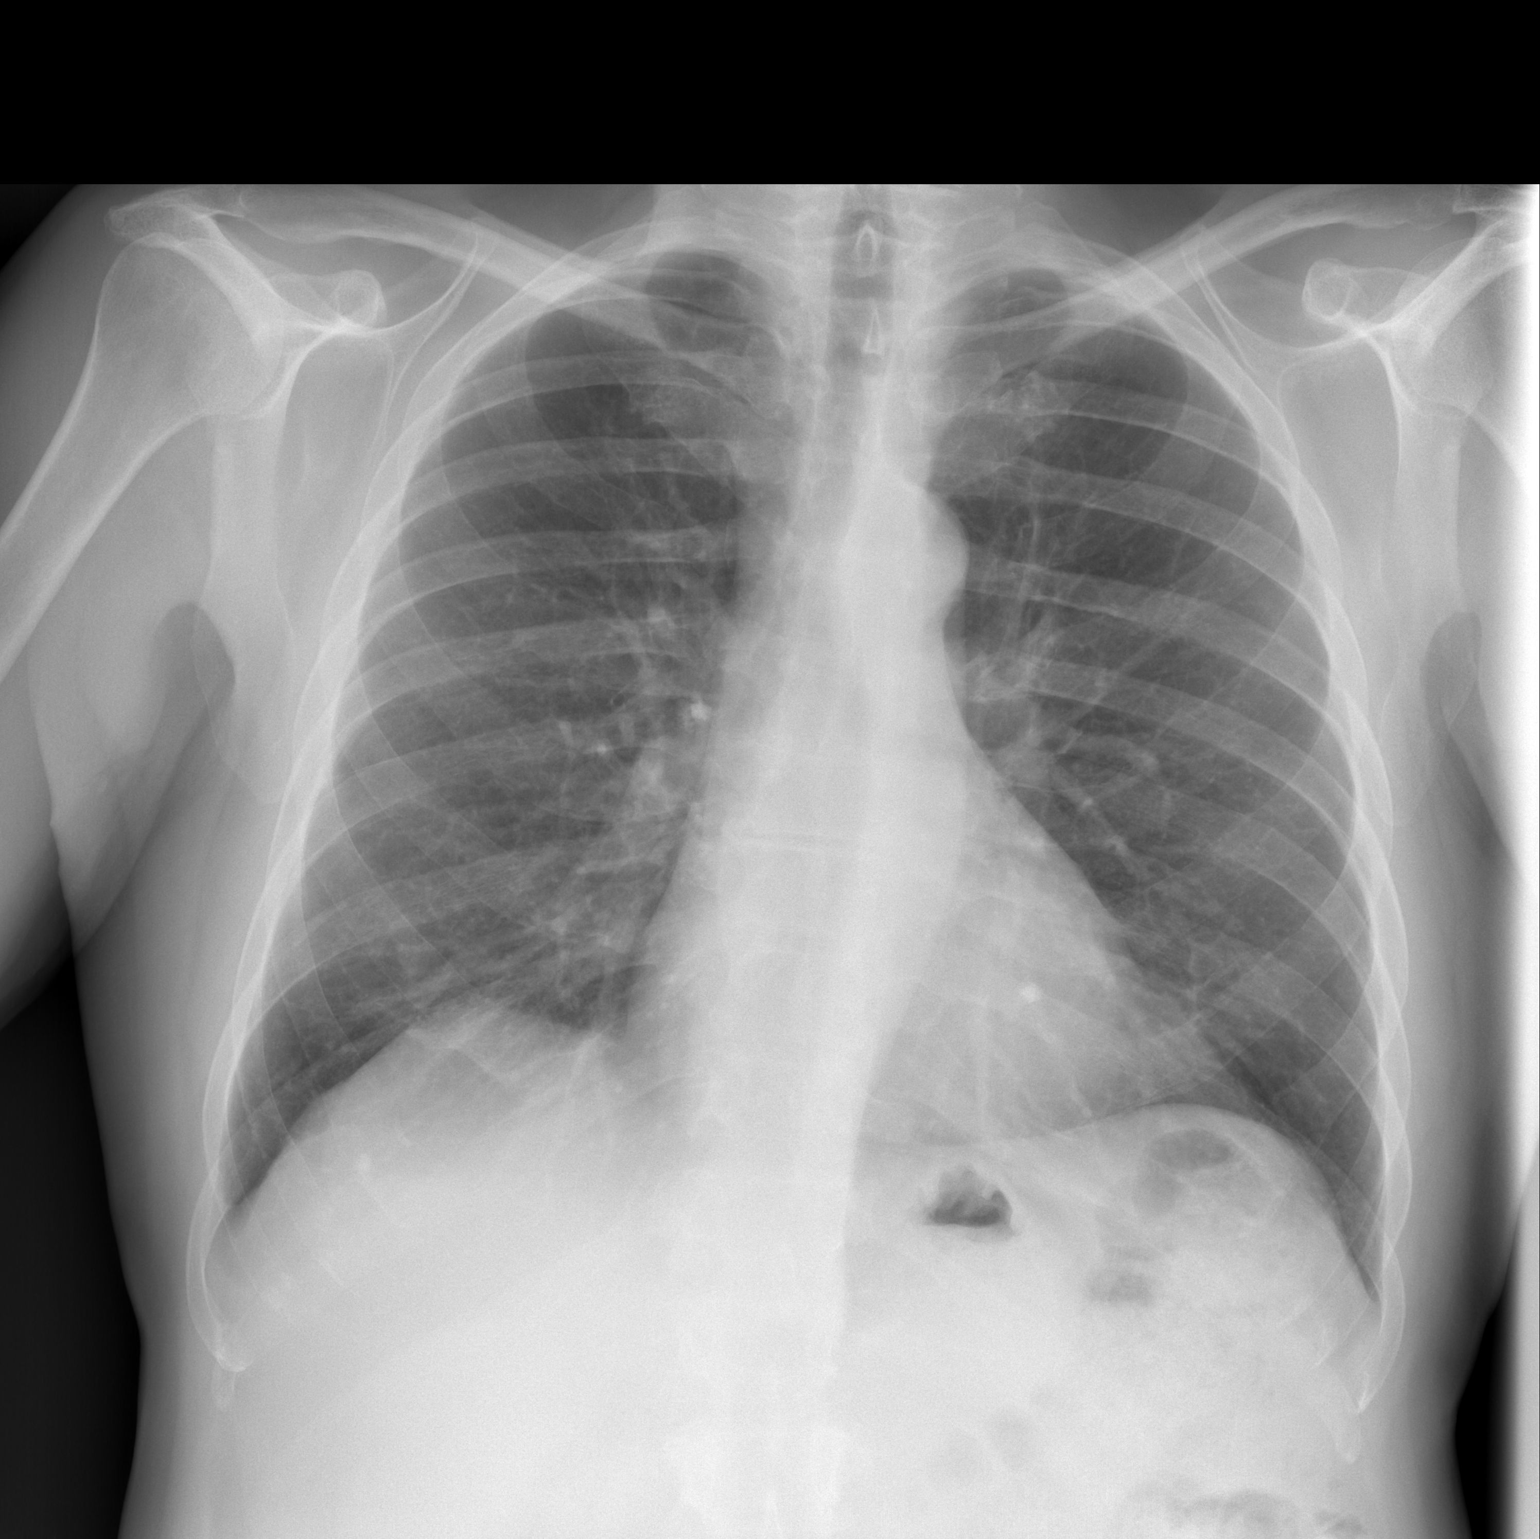

[w chest lat]
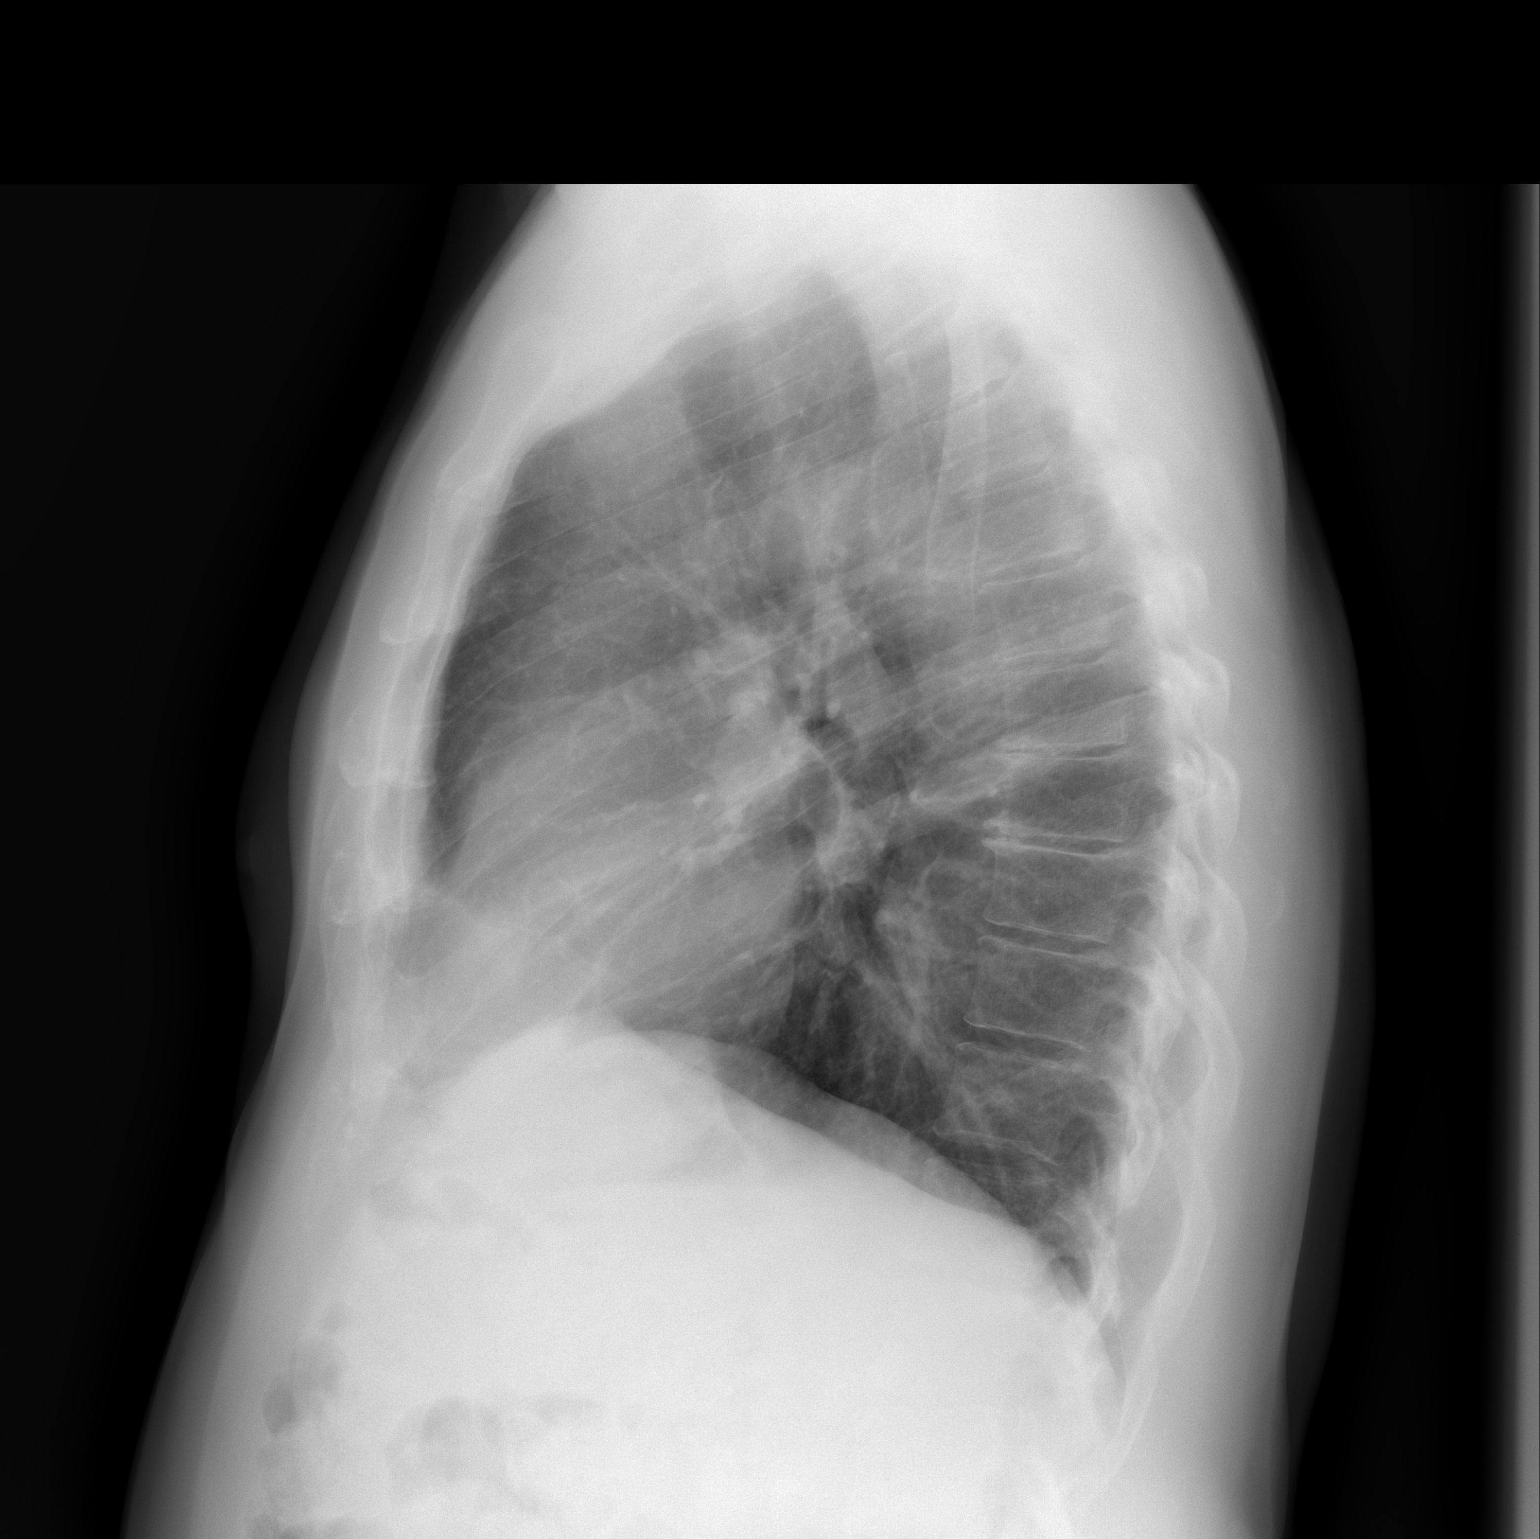

[2 of 2 positions shown; findings below may reference images not displayed]

FINDINGS: The heart size and mediastinal contours are within normal limits.
Both lungs are clear. The visualized skeletal structures are
unremarkable.
IMPRESSION: No active cardiopulmonary disease.

## 2019-12-23 HISTORY — PX: CARDIAC CATHETERIZATION: SHX172

## 2023-09-28 ENCOUNTER — Encounter: Payer: Self-pay | Admitting: *Deleted

## 2023-09-30 ENCOUNTER — Other Ambulatory Visit: Payer: Self-pay | Admitting: *Deleted

## 2023-09-30 ENCOUNTER — Encounter: Payer: Self-pay | Admitting: Gastroenterology

## 2023-09-30 ENCOUNTER — Encounter: Payer: Self-pay | Admitting: *Deleted

## 2023-09-30 ENCOUNTER — Ambulatory Visit (INDEPENDENT_AMBULATORY_CARE_PROVIDER_SITE_OTHER): Payer: Medicare Other | Admitting: Gastroenterology

## 2023-09-30 VITALS — BP 135/80 | HR 69 | Temp 98.6°F | Ht 70.0 in | Wt 174.8 lb

## 2023-09-30 DIAGNOSIS — Z860101 Personal history of adenomatous and serrated colon polyps: Secondary | ICD-10-CM | POA: Diagnosis not present

## 2023-09-30 MED ORDER — PLENVU 140 G PO SOLR: 1.0000 | ORAL | 0 refills | Status: AC

## 2023-09-30 NOTE — Progress Notes (Signed)
GI Office Note    Referring Provider: Romeo Rabon, MD Primary Care Physician:  Romeo Rabon, MD  Primary Gastroenterologist: Roetta Sessions, MD   Chief Complaint   Chief Complaint  Patient presents with   Colonoscopy     History of Present Illness   David Briggs is a 77 y.o. male presenting today to schedule five year surveillance colonoscopy. He has history of colon polyps, has to have colonoscopy every five years. He had last several colonoscopies in [place] but desired switching providers and locations and at request of his friend, would like for Dr. Jena Gauss to complete his colonoscopy.   Patient denies any bowel concerns. Occasional constipation but overall bowels regular. No melena, brbpr. No abdominal pain. No heartburn, vomiting, dysphagia.    Labs from August 2024: Creatinine 1, sodium 138, potassium 4.2, albumin 3.5, total bilirubin 0.9, AST 18, ALT 21, alkaline phosphatase 111, A1c 6.9  Medications   Current Outpatient Medications  Medication Sig Dispense Refill   aspirin EC 81 MG tablet Take 81 mg by mouth daily. Swallow whole.     atorvastatin (LIPITOR) 80 MG tablet Take 80 mg by mouth at bedtime.     isosorbide mononitrate (IMDUR) 30 MG 24 hr tablet Take 30 mg by mouth daily.     meloxicam (MOBIC) 15 MG tablet Take 15 mg by mouth daily as needed.     metoprolol succinate (TOPROL-XL) 25 MG 24 hr tablet Take 25 mg by mouth daily.     nitroGLYCERIN (NITROSTAT) 0.4 MG SL tablet Place 0.4 mg under the tongue every 5 (five) minutes as needed for chest pain.     ranolazine (RANEXA) 1000 MG SR tablet Take 1,000 mg by mouth 2 (two) times daily.     Vitamin D, Ergocalciferol, (DRISDOL) 1.25 MG (50000 UNIT) CAPS capsule Take 50,000 Units by mouth every 7 (seven) days.     No current facility-administered medications for this visit.    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   Hyperlipidemia    Nocturia    Prostate cancer (HCC)    recently Dx with Prostate cancer   Prostate cancer (HCC)    Vitamin D deficiency     Past Surgical History   Past Surgical History:  Procedure Laterality Date   COLONOSCOPY W/ BIOPSIES AND POLYPECTOMY     HERNIA REPAIR Bilateral    inguinal with mesh   LUMBAR LAMINECTOMY/DECOMPRESSION MICRODISCECTOMY Right 04/05/2014   Procedure: Right Lumbar one-two Microdiskectomy;  Surgeon: Maeola Harman, MD;  Location: [facility];  Service: Neurosurgery;  Laterality: Right;   LYMPHADENECTOMY Bilateral 07/03/2014   Procedure: LYMPHADENECTOMY;  Surgeon: Heloise Purpura, MD;  Location: [facility];  Service: Urology;  Laterality: Bilateral;   ROBOT ASSISTED LAPAROSCOPIC RADICAL PROSTATECTOMY N/A 07/03/2014   Procedure: ROBOTIC ASSISTED LAPAROSCOPIC RADICAL PROSTATECTOMY LEVEL 2;  Surgeon: Heloise Purpura, MD;  Location: [facility];  Service: Urology;  Laterality: N/A;   throat cancer  2011   radiation x35   TONSILLECTOMY      Past Family History   Family History  Problem Relation Age of Onset   Lung cancer Father    Colon cancer Neg Hx     Past Social History   Social History   Socioeconomic History   Marital status: Married    Spouse name: Not on file   Number of children: Not on file   Years of education: Not on file   Highest education level: Not on file  Occupational History   Occupation: retired from [employer]  Tobacco Use   Smoking status: Former    Current packs/day: 0.00    Average packs/day: 1 pack/day for 20.0 years (20.0 ttl pk-yrs)    Types: Cigarettes    Start date: 06/29/1974    Quit date: 06/29/1994    Years since quitting: 29.2   Smokeless tobacco: Never  Substance and Sexual Activity   Alcohol use: No   Drug use: No   Sexual activity: Not Currently  Other Topics Concern   Not on file  Social History Narrative   Not on file   Social Determinants of Health   Financial Resource Strain: Not on file  Food Insecurity: Not on file  Transportation Needs: Not on file  Physical Activity: Not on file  Stress: Not on file  Social Connections: Not on file  Intimate Partner Violence: Not on file    Review of Systems   General: Negative for anorexia, weight loss, fever, chills, fatigue, weakness. Eyes: Negative for vision changes.  ENT: Negative for hoarseness, difficulty swallowing , nasal congestion. CV: Negative for chest pain, angina, palpitations, dyspnea on exertion, peripheral edema. Angina controlled with medication, has not required SL NTG. Respiratory: Negative for dyspnea at rest, dyspnea on exertion, cough, sputum, wheezing.  GI: See history of present illness. GU:  Negative for dysuria, hematuria, urinary incontinence, urinary frequency, nocturnal urination.  MS: Negative for joint pain, low back pain.  Derm: Negative for rash or itching.  Neuro: Negative for weakness, abnormal sensation, seizure, frequent headaches, memory loss,  confusion.  Psych: Negative for anxiety, depression, suicidal ideation, hallucinations.  Endo: Negative for unusual weight change.  Heme: Negative for bruising or bleeding. Allergy: Negative for rash or hives.  Physical Exam   BP 135/80 (BP Location: Right Arm, Patient Position: Sitting, Cuff Size: Normal)   Pulse 69   Temp 98.6 F (37 C) (Oral)   Ht 5\' 10"  (1.778 m)   Wt 174 lb 12.8 oz (79.3 kg)   SpO2 98%   BMI 25.08 kg/m     General: Well-nourished, well-developed in no acute distress.  Head: Normocephalic, atraumatic.   Eyes: Conjunctiva pink, no icterus. Mouth: Oropharyngeal mucosa moist and pink . Neck: Supple without thyromegaly, masses, or lymphadenopathy.  Lungs: Clear to auscultation bilaterally.  Heart: Regular rate and rhythm, no murmurs rubs or gallops.  Abdomen: Bowel sounds are normal, nontender, nondistended, no hepatosplenomegaly or masses,  no abdominal bruits or hernia, no rebound or guarding.   Rectal: not performed Extremities: No lower extremity edema. No clubbing or deformities.  Neuro: Alert and oriented x 4 , grossly normal neurologically.  Skin: Warm and dry, no rash or jaundice.   Psych: Alert and cooperative, normal mood and affect.  Labs   See hpi  Imaging Studies   No results found.  Assessment/Plan:  H/O adenomatous colon polyps, due for five year surveillance colonoscopy. Patient requests Dr. Jena Gauss.  ASA 3.  I have discussed the risks, alternatives, benefits with regards to but not limited to the risk of reaction to medication, bleeding, infection, perforation and the patient is agreeable to proceed. Written consent to be obtained.   Leanna Battles. Melvyn Neth, MHS, PA-C [facility]

## 2023-09-30 NOTE — Patient Instructions (Signed)
Very nice to meet you today!  We will be calling soon to schedule colonoscopy for November as soon as the new dates are opened up for Dr. Jena Gauss. I have let the scheduler know that you are available prior to November 21st but not the 8th.  Call if any questions or concerns.

## 2023-10-01 ENCOUNTER — Encounter: Payer: Self-pay | Admitting: *Deleted

## 2023-11-04 ENCOUNTER — Telehealth: Payer: Self-pay | Admitting: *Deleted

## 2023-11-04 NOTE — Telephone Encounter (Signed)
Pt called and stated he seen the cardiologist today Dr.Kotlaba today. He said that the doctor faxed clearance for pt to have the procedure done. He is unsure if letter was faxed or mailed. Advised pt that we haven't received the letter at this time. Pt verbalized understanding.

## 2023-11-04 NOTE — Telephone Encounter (Signed)
Letter from cardiology has been scanned under media tab.

## 2023-11-05 NOTE — Telephone Encounter (Signed)
Noted. Patient already scheduled.

## 2023-11-09 NOTE — Patient Instructions (Signed)
David Briggs  11/09/2023     @[email or web address] @   Your procedure is scheduled on 11/11/2023.   [facility] to [facility] at [address] this number if you have problems the morning of surgery:[phone]  [phone]  If you experience any cold or flu symptoms such as cough, fever, chills, shortness of breath, etc. between now and your scheduled surgery, please notify us at the above number.   Remember:   Please Follow the diet and prep instructions given to you by Dr Luvenia Starch office.     Take these medicines the morning of surgery with A SIP OF WATER : Imdur Meloxicam Metoprolol and Ranexq    Do not wear jewelry, make-up or nail polish, including gel polish,  artificial nails, or any other type of covering on natural nails (fingers and  toes).  Do not wear lotions, powders, or perfumes, or deodorant.  Do not shave 48 hours prior to surgery.  Men may shave face and neck.  Do not bring valuables to the hospital.  [facility] is not responsible for any belongings or valuables.  Contacts, dentures or bridgework may not be worn into surgery.  Leave your suitcase in the car.  After surgery it may be brought to your room.  For patients admitted to the hospital, discharge time will be determined by your treatment team.  Patients discharged the day of surgery will not be allowed to drive home.   Name and phone number of your driver:   Family Special instructions:  N/A  Please read over the following fact sheets that you were given.  Care and Recovery After Surgery  Colonoscopy, Adult A colonoscopy is a procedure to look at the entire large intestine. This procedure is done using a long, thin, flexible tube that has a camera on the end. You may have a colonoscopy: As a part of normal colorectal screening. If you have certain symptoms, such as: A low number of red blood cells in your blood (anemia). Diarrhea that does not go away. Pain in your abdomen. Blood in your  stool. A colonoscopy can help screen for and diagnose medical problems, including: An abnormal growth of cells or tissue (tumor). Abnormal growths within the lining of your intestine (polyps). Inflammation. Areas of bleeding. Tell your health care provider about: Any allergies you have. All medicines you are taking, including vitamins, herbs, eye drops, creams, and over-the-counter medicines. Any problems you or family members have had with anesthetic medicines. Any bleeding problems you have. Any surgeries you have had. Any medical conditions you have. Any problems you have had with having bowel movements. Whether you are pregnant or may be pregnant. What are the risks? Generally, this is a safe procedure. However, problems may occur, including: Bleeding. Damage to your intestine. Allergic reactions to medicines given during the procedure. Infection. This is rare. What happens before the procedure? Eating and drinking restrictions Follow instructions from your health care provider about eating or drinking restrictions, which may include: A few days before the procedure: Follow a low-fiber diet. Avoid nuts, seeds, dried fruit, raw fruits, and vegetables. 1-3 days before the procedure: Eat only gelatin dessert or ice pops. Drink only clear liquids, such as water, clear juice, clear broth or bouillon, black coffee or tea, or clear soft drinks or sports drinks. Avoid liquids that contain red or purple dye. The day of the procedure: Do not eat solid foods. You may continue to drink clear liquids until up to 2 hours  before the procedure. Do not eat or drink anything starting 2 hours before the procedure, or within the time period that your health care provider recommends. Bowel prep If you were prescribed a bowel prep to take by mouth (orally) to clean out your colon: Take it as told by your health care provider. Starting the day before your procedure, you will need to drink a large  amount of liquid medicine. The liquid will cause you to have many bowel movements of loose stool until your stool becomes almost clear or light green. If your skin or the opening between the buttocks (anus) gets irritated from diarrhea, you may relieve the irritation using: Wipes with medicine in them, such as adult wet wipes with aloe and vitamin E. A product to soothe skin, such as petroleum jelly. If you vomit while drinking the bowel prep: Take a break for up to 60 minutes. Begin the bowel prep again. Call your health care provider if you keep vomiting or you cannot take the bowel prep without vomiting. To clean out your colon, you may also be given: Laxative medicines. These help you have a bowel movement. Instructions for enema use. An enema is liquid medicine injected into your rectum. Medicines Ask your health care provider about: Changing or stopping your regular medicines or supplements. This is especially important if you are taking iron supplements, diabetes medicines, or blood thinners. Taking medicines such as aspirin and ibuprofen. These medicines can thin your blood. Do not take these medicines unless your health care provider tells you to take them. Taking over-the-counter medicines, vitamins, herbs, and supplements. General instructions Ask your health care provider what steps will be taken to help prevent infection. These may include washing skin with a germ-killing soap. If you will be going home right after the procedure, plan to have a responsible adult: Take you home from the hospital or clinic. You will not be allowed to drive. Care for you for the time you are told. What happens during the procedure?  An IV will be inserted into one of your veins. You will be given a medicine to make you fall asleep (general anesthetic). You will lie on your side with your knees bent. A lubricant will be put on the tube. Then the tube will be: Inserted into your anus. Gently eased  through all parts of your large intestine. Air will be sent into your colon to keep it open. This may cause some pressure or cramping. Images will be taken with the camera and will appear on a screen. A small tissue sample may be removed to be looked at under a microscope (biopsy). The tissue may be sent to a lab for testing if any signs of problems are found. If small polyps are found, they may be removed and checked for cancer cells. When the procedure is finished, the tube will be removed. The procedure may vary among health care providers and hospitals. What happens after the procedure? Your blood pressure, heart rate, breathing rate, and blood oxygen level will be monitored until you leave the hospital or clinic. You may have a small amount of blood in your stool. You may pass gas and have mild cramping or bloating in your abdomen. This is caused by the air that was used to open your colon during the exam. If you were given a sedative during the procedure, it can affect you for several hours. Do not drive or operate machinery until your health care provider says that it is safe.  It is up to you to get the results of your procedure. Ask your health care provider, or the department that is doing the procedure, when your results will be ready. Summary A colonoscopy is a procedure to look at the entire large intestine. Follow instructions from your health care provider about eating and drinking before the procedure. If you were prescribed an oral bowel prep to clean out your colon, take it as told by your health care provider. During the colonoscopy, a flexible tube with a camera on its end is inserted into the anus and then passed into all parts of the large intestine. This information is not intended to replace advice given to you by your health care provider. Make sure you discuss any questions you have with your health care provider. Document Revised: 01/20/2023 Document Reviewed:  07/31/2021 Elsevier Patient Education  2024 [employer]  Monitored Anesthesia Care Anesthesia refers to the techniques, procedures, and medicines that help a person stay safe and comfortable during surgery. Monitored anesthesia care, or sedation, is one type of anesthesia. You may have sedation if you do not need to be asleep for your procedure. Procedures that use sedation may include: Surgery to remove cataracts from your eyes. A dental procedure. A biopsy. This is when a tissue sample is removed and looked at under a microscope. You will be watched closely during your procedure. Your level of sedation or type of anesthesia may be changed to fit your needs. Tell a health care provider about: Any allergies you have. All medicines you are taking, including vitamins, herbs, eye drops, creams, and over-the-counter medicines. Any problems you or family members have had with anesthesia. Any bleeding problems you have. Any surgeries you have had. Any medical conditions or illnesses you have. This includes sleep apnea, cough, fever, or the flu. Whether you are pregnant or may be pregnant. Whether you use cigarettes, alcohol, or drugs. Any use of steroids, whether by mouth or as a cream. What are the risks? Your health care provider will talk with you about risks. These may include: Getting too much medicine (oversedation). Nausea. Allergic reactions to medicines. Trouble breathing. If this happens, a breathing tube may be used to help you breathe. It will be removed when you are awake and breathing on your own. Heart trouble. Lung trouble. Confusion that gets better with time (emergence delirium). What happens before the procedure? When to stop eating and drinking Follow instructions from your health care provider about what you may eat and drink. These may include: 8 hours before your procedure Stop eating most foods. Do not eat meat, fried foods, or fatty foods. Eat only light foods,  such as toast or crackers. All liquids are okay except energy drinks and alcohol. 6 hours before your procedure Stop eating. Drink only clear liquids, such as water, clear fruit juice, black coffee, plain tea, and sports drinks. Do not drink energy drinks or alcohol. 2 hours before your procedure Stop drinking all liquids. You may be allowed to take medicines with small sips of water. If you do not follow your health care provider's instructions, your procedure may be delayed or canceled. Medicines Ask your health care provider about: Changing or stopping your regular medicines. These include any diabetes medicines or blood thinners you take. Taking medicines such as aspirin and ibuprofen. These medicines can thin your blood. Do not take them unless your health care provider tells you to. Taking over-the-counter medicines, vitamins, herbs, and supplements. Testing You may have an exam  or testing. You may have a blood or urine sample taken. General instructions Do not use any products that contain nicotine or tobacco for at least 4 weeks before the procedure. These products include cigarettes, chewing tobacco, and vaping devices, such as e-cigarettes. If you need help quitting, ask your health care provider. If you will be going home right after the procedure, plan to have a responsible adult: Take you home from the hospital or clinic. You will not be allowed to drive. Care for you for the time you are told. What happens during the procedure?  Your blood pressure, heart rate, breathing, level of pain, and blood oxygen level will be monitored. An IV will be inserted into one of your veins. You may be given: A sedative. This helps you relax. Anesthesia. This will: Numb certain areas of your body. Make you fall asleep for surgery. You will be given medicines as needed to keep you comfortable. The more medicine you are given, the deeper your level of sedation will be. Your level of  sedation may be changed to fit your needs. There are three levels of sedation: Mild sedation. At this level, you may feel awake and relaxed. You will be able to follow directions. Moderate sedation. At this level, you will be sleepy. You may not remember the procedure. Deep sedation. At this level, you will be asleep. You will not remember the procedure. How you get the medicines will depend on your age and the procedure. They may be given as: A pill. This may be taken by mouth (orally) or inserted into the rectum. An injection. This may be into a vein or muscle. A spray through the nose. After your procedure is over, the medicine will be stopped. The procedure may vary among health care providers and hospitals. What happens after the procedure? Your blood pressure, heart rate, breathing rate, and blood oxygen level will be monitored until you leave the hospital or clinic. You may feel sleepy, clumsy, or nauseous. You may not remember what happened during or after the procedure. Sedation can affect you for several hours. Do not drive or use machinery until your health care provider says that it is safe. This information is not intended to replace advice given to you by your health care provider. Make sure you discuss any questions you have with your health care provider. Document Revised: 05/05/2022 Document Reviewed: 05/05/2022 Elsevier Patient Education  2024 [employer].

## 2023-11-10 ENCOUNTER — Encounter (HOSPITAL_COMMUNITY): Payer: Self-pay

## 2023-11-10 ENCOUNTER — Encounter (HOSPITAL_COMMUNITY)
Admission: RE | Admit: 2023-11-10 | Discharge: 2023-11-10 | Disposition: A | Discharge status: Home / Self Care | Payer: Medicare Other | Source: Ambulatory Visit | Attending: Internal Medicine | Admitting: Internal Medicine

## 2023-11-10 VITALS — BP 133/72 | HR 62 | Temp 97.7°F | Resp 18 | Ht 70.0 in | Wt 174.8 lb

## 2023-11-10 DIAGNOSIS — I519 Heart disease, unspecified: Secondary | ICD-10-CM | POA: Insufficient documentation

## 2023-11-10 DIAGNOSIS — E0800 Diabetes mellitus due to underlying condition with hyperosmolarity without nonketotic hyperglycemic-hyperosmolar coma (NKHHC): Secondary | ICD-10-CM | POA: Diagnosis not present

## 2023-11-10 DIAGNOSIS — Z01812 Encounter for preprocedural laboratory examination: Secondary | ICD-10-CM | POA: Diagnosis present

## 2023-11-10 DIAGNOSIS — I4519 Other right bundle-branch block: Secondary | ICD-10-CM | POA: Insufficient documentation

## 2023-11-10 DIAGNOSIS — Z01818 Encounter for other preprocedural examination: Secondary | ICD-10-CM | POA: Diagnosis not present

## 2023-11-10 DIAGNOSIS — Z01810 Encounter for preprocedural cardiovascular examination: Secondary | ICD-10-CM | POA: Diagnosis present

## 2023-11-10 HISTORY — DX: Nausea with vomiting, unspecified: Z98.890

## 2023-11-10 LAB — BASIC METABOLIC PANEL
Anion gap: 7 (ref 5–15)
BUN: 16 mg/dL (ref 8–23)
CO2: 27 mmol/L (ref 22–32)
Calcium: 9.3 mg/dL (ref 8.9–10.3)
Chloride: 103 mmol/L (ref 98–111)
Creatinine, Ser: 0.84 mg/dL (ref 0.61–1.24)
GFR, Estimated: >60 mL/min (ref >60)
Glucose, Bld: 150 mg/dL — ABNORMAL HIGH (ref 70–99)
Potassium: 4.1 mmol/L (ref 3.5–5.1)
Sodium: 137 mmol/L (ref 135–145)

## 2023-11-11 ENCOUNTER — Ambulatory Visit (HOSPITAL_COMMUNITY): Payer: Self-pay | Admitting: Anesthesiology

## 2023-11-11 ENCOUNTER — Ambulatory Visit (HOSPITAL_COMMUNITY)
Admission: RE | Admit: 2023-11-11 | Discharge: 2023-11-11 | Disposition: A | Discharge status: Home / Self Care | Payer: Medicare Other | Attending: Internal Medicine | Admitting: Internal Medicine

## 2023-11-11 ENCOUNTER — Encounter (HOSPITAL_COMMUNITY): Payer: Self-pay | Admitting: Internal Medicine

## 2023-11-11 ENCOUNTER — Encounter (HOSPITAL_COMMUNITY)
Admission: RE | Disposition: A | Discharge status: Home / Self Care | Payer: Self-pay | Source: Home / Self Care | Attending: Internal Medicine

## 2023-11-11 DIAGNOSIS — Z860100 Personal history of colon polyps, unspecified: Secondary | ICD-10-CM | POA: Insufficient documentation

## 2023-11-11 DIAGNOSIS — Z87891 Personal history of nicotine dependence: Secondary | ICD-10-CM | POA: Diagnosis not present

## 2023-11-11 DIAGNOSIS — Z1211 Encounter for screening for malignant neoplasm of colon: Secondary | ICD-10-CM | POA: Diagnosis present

## 2023-11-11 DIAGNOSIS — Z860101 Personal history of adenomatous and serrated colon polyps: Secondary | ICD-10-CM

## 2023-11-11 DIAGNOSIS — I2510 Atherosclerotic heart disease of native coronary artery without angina pectoris: Secondary | ICD-10-CM | POA: Insufficient documentation

## 2023-11-11 DIAGNOSIS — Z85819 Personal history of malignant neoplasm of unspecified site of lip, oral cavity, and pharynx: Secondary | ICD-10-CM | POA: Insufficient documentation

## 2023-11-11 DIAGNOSIS — I10 Essential (primary) hypertension: Secondary | ICD-10-CM | POA: Diagnosis not present

## 2023-11-11 DIAGNOSIS — Z8546 Personal history of malignant neoplasm of prostate: Secondary | ICD-10-CM | POA: Insufficient documentation

## 2023-11-11 DIAGNOSIS — Z85828 Personal history of other malignant neoplasm of skin: Secondary | ICD-10-CM | POA: Insufficient documentation

## 2023-11-11 DIAGNOSIS — E119 Type 2 diabetes mellitus without complications: Secondary | ICD-10-CM | POA: Diagnosis not present

## 2023-11-11 HISTORY — PX: FLEXIBLE SIGMOIDOSCOPY: SHX5431

## 2023-11-11 LAB — GLUCOSE, CAPILLARY: Glucose-Capillary: 134 mg/dL — ABNORMAL HIGH (ref 70–99)

## 2023-11-11 SURGERY — SIGMOIDOSCOPY, FLEXIBLE
Anesthesia: General

## 2023-11-11 MED ORDER — PHENYLEPHRINE 80 MCG/ML (10ML) SYRINGE FOR IV PUSH (FOR BLOOD PRESSURE SUPPORT)
PREFILLED_SYRINGE | INTRAVENOUS | Status: AC
  Filled 2023-11-11: qty 10

## 2023-11-11 MED ORDER — LIDOCAINE HCL (PF) 2 % IJ SOLN
INTRAMUSCULAR | Status: DC | PRN
  Administered 2023-11-11: 50 mg via INTRADERMAL

## 2023-11-11 MED ORDER — PROPOFOL 10 MG/ML IV BOLUS
INTRAVENOUS | Status: DC | PRN
  Administered 2023-11-11: 100 mg via INTRAVENOUS

## 2023-11-11 MED ORDER — LACTATED RINGERS IV SOLN: INTRAVENOUS | Status: DC

## 2023-11-11 MED ORDER — PHENYLEPHRINE 80 MCG/ML (10ML) SYRINGE FOR IV PUSH (FOR BLOOD PRESSURE SUPPORT)
PREFILLED_SYRINGE | INTRAVENOUS | Status: DC | PRN
  Administered 2023-11-11 (×3): 160 ug via INTRAVENOUS

## 2023-11-11 MED ORDER — PROPOFOL 500 MG/50ML IV EMUL
INTRAVENOUS | Status: DC | PRN
  Administered 2023-11-11: 100 ug/kg/min via INTRAVENOUS

## 2023-11-11 NOTE — Op Note (Signed)
[facility] Patient Name: David Briggs Procedure Date: 11/11/2023 9:27 AM MRN: [id number] Date of Birth: 01/01/1946 Attending David Briggs: Gennette Pac , David Briggs, [id number] CSN: [id number] Age: 77 Admit Type: Outpatient Procedure:                Sigmoidoscopy (attempted colonoscopy) Indications:              High risk colon cancer surveillance: Personal                            history of colonic polyps Providers:                Gennette Pac, David Briggs, Nena Polio, RN, Francoise Ceo RN, RN, Elinor Parkinson Referring David Briggs:             Gennette Pac, David Briggs Medicines:                Propofol per Anesthesia Complications:            No immediate complications. Estimated Blood Loss:     Estimated blood loss: none. Procedure:                Pre-Anesthesia Assessment:                           - Prior to the procedure, a History and Physical                            was performed, and patient medications and                            allergies were reviewed. The patient's tolerance of                            previous anesthesia was also reviewed. The risks                            and benefits of the procedure and the sedation                            options and risks were discussed with the patient.                            All questions were answered, and informed consent                            was obtained. Prior Anticoagulants: The patient has                            taken no anticoagulant or antiplatelet agents. ASA                            Grade Assessment: III - A patient with severe  systemic disease. After reviewing the risks and                            benefits, the patient was deemed in satisfactory                            condition to undergo the procedure.                           After obtaining informed consent, the colonoscope                            was passed under direct vision.  Throughout the                            procedure, the patient's blood pressure, pulse, and                            oxygen saturations were monitored continuously. The                            [id number]) scope was introduced through the                            anus with the intention of advancing to the cecum.                            The scope was advanced to the sigmoid colon before                            the procedure was aborted. Medications were given.                            The colonoscopy was performed without difficulty.                            The patient tolerated the procedure well. The                            quality of the bowel preparation was inadequate. Scope In: 10:13:09 AM Scope Out: 10:17:53 AM Total Procedure Duration: 0 hours 4 minutes 44 seconds  Findings:      The perianal and digital rectal examinations were normal. Formed stool       elements in the rectum trailing up in the sigmoid. Prep appeared to be       obviously an adequate. The procedure was terminated Impression:               - Preparation of the colon was inadequate.                           - No specimens collected. Aborted procedure. Moderate Sedation:      Moderate (conscious) sedation was personally administered by an       anesthesia professional. The following parameters were monitored: oxygen  saturation, heart rate, blood pressure, respiratory rate, EKG, adequacy       of pulmonary ventilation, and response to care. Recommendation:           - Patient has a contact number available for                            emergencies. The signs and symptoms of potential                            delayed complications were discussed with the                            patient. Return to normal activities tomorrow.                            Written discharge instructions were provided to the                            patient.                           - Advance  diet as tolerated.                           - Continue present medications.                           - Repeat colonoscopy at appointment to be scheduled                            because the bowel preparation was poor.                           - Return to GI office (date not yet determined). Procedure Code(s):        --- Professional ---                           [id number], 53, Colonoscopy, flexible; diagnostic,                            including collection of specimen(s) by brushing or                            washing, when performed (separate procedure) Diagnosis Code(s):        --- Professional ---                           Z86.010, Personal history of colonic polyps CPT copyright 2022 [employer]. All rights reserved. The codes documented in this report are preliminary and upon coder review may  be revised to meet current compliance requirements. David Friends. Helmut Hennon, David Briggs Gennette Pac, David Briggs 11/11/2023 10:28:13 AM This report has been signed electronically. Number of Addenda: 0

## 2023-11-11 NOTE — Discharge Instructions (Addendum)
  Sigmoidoscopy  discharge Instructions  Read the instructions outlined below and refer to this sheet in the next few weeks. These discharge instructions provide you with general information on caring for yourself after you leave the hospital. Your doctor may also give you specific instructions. While your treatment has been planned according to the most current medical practices available, unavoidable complications occasionally occur. If you have any problems or questions after discharge, call Dr. Jena Gauss at [phone]. ACTIVITY You may resume your regular activity, but move at a slower pace for the next 24 hours.  Take frequent rest periods for the next 24 hours.  Walking will help get rid of the air and reduce the bloated feeling in your belly (abdomen).  No driving for 24 hours (because of the medicine (anesthesia) used during the test).   Do not sign any important legal documents or operate any machinery for 24 hours (because of the anesthesia used during the test).  NUTRITION Drink plenty of fluids.  You may resume your normal diet as instructed by your doctor.  Begin with a light meal and progress to your normal diet. Heavy or fried foods are harder to digest and may make you feel sick to your stomach (nauseated).  Avoid alcoholic beverages for 24 hours or as instructed.  MEDICATIONS You may resume your normal medications unless your doctor tells you otherwise.  WHAT YOU CAN EXPECT TODAY Some feelings of bloating in the abdomen.  Passage of more gas than usual.  Spotting of blood in your stool or on the toilet paper.  IF YOU HAD POLYPS REMOVED DURING THE COLONOSCOPY: No aspirin products for 7 days or as instructed.  No alcohol for 7 days or as instructed.  Eat a soft diet for the next 24 hours.  FINDING OUT THE RESULTS OF YOUR TEST Not all test results are available during your visit. If your test results are not back during the visit, make an appointment with your caregiver to find out  the results. Do not assume everything is normal if you have not heard from your caregiver or the medical facility. It is important for you to follow up on all of your test results.  SEEK IMMEDIATE MEDICAL ATTENTION IF: You have more than a spotting of blood in your stool.  Your belly is swollen (abdominal distention).  You are nauseated or vomiting.  You have a temperature over 101.  You have abdominal pain or discomfort that is severe or gets worse throughout the day.    Your preparation was poor today.  A colonoscopy could not be completed.  My office will be in touch about rescheduling.  At patient request, I called Carie Caddy [phone]-reviewed findings

## 2023-11-11 NOTE — Anesthesia Preprocedure Evaluation (Signed)
Anesthesia Evaluation  Patient identified by MRN, date of birth, ID band Patient awake    Reviewed: Allergy & Precautions, H&P , NPO status , Patient's Chart, lab work & pertinent test results, reviewed documented beta blocker date and time   Airway Mallampati: II  TM Distance: >3 FB Neck ROM: full    Dental no notable dental hx.    Pulmonary neg pulmonary ROS, former smoker   Pulmonary exam normal breath sounds clear to auscultation       Cardiovascular Exercise Tolerance: Good hypertension, negative cardio ROS  Rhythm:regular Rate:Normal     Neuro/Psych negative neurological ROS  negative psych ROS   GI/Hepatic negative GI ROS, Neg liver ROS,,,  Endo/Other  negative endocrine ROSdiabetes    Renal/GU negative Renal ROS  negative genitourinary   Musculoskeletal   Abdominal   Peds  Hematology negative hematology ROS (+)   Anesthesia Other Findings   Reproductive/Obstetrics negative OB ROS                             Anesthesia Physical Anesthesia Plan  ASA: 3  Anesthesia Plan: General   Post-op Pain Management:    Induction:   PONV Risk Score and Plan: Propofol infusion  Airway Management Planned:   Additional Equipment:   Intra-op Plan:   Post-operative Plan:   Informed Consent: I have reviewed the patients History and Physical, chart, labs and discussed the procedure including the risks, benefits and alternatives for the proposed anesthesia with the patient or authorized representative who has indicated his/her understanding and acceptance.     Dental Advisory Given  Plan Discussed with: CRNA  Anesthesia Plan Comments:        Anesthesia Quick Evaluation

## 2023-11-11 NOTE — Anesthesia Procedure Notes (Signed)
Date/Time: 11/11/2023 10:07 AM  Performed by: Julian Reil, CRNAPre-anesthesia Checklist: Patient identified, Emergency Drugs available, Patient being monitored and Suction available Patient Re-evaluated:Patient Re-evaluated prior to induction Oxygen Delivery Method: Nasal cannula Induction Type: IV induction Placement Confirmation: positive ETCO2

## 2023-11-11 NOTE — Transfer of Care (Signed)
Immediate Anesthesia Transfer of Care Note  Patient: David Briggs  Procedure(s) Performed: FLEXIBLE SIGMOIDOSCOPY  Patient Location: Short Stay  Anesthesia Type:General  Level of Consciousness: awake  Airway & Oxygen Therapy: Patient Spontanous Breathing  Post-op Assessment: Report given to RN and Post -op Vital signs reviewed and stable  Post vital signs: Reviewed and stable  Last Vitals:  Vitals Value Taken Time  BP 76/49 11/11/23 1022  Temp 36.4 C 11/11/23 1022  Pulse 60 11/11/23 1022  Resp 15 11/11/23 1022  SpO2 98 % 11/11/23 1022    Last Pain:  Vitals:   11/11/23 1022  TempSrc: Oral  PainSc: 0-No pain         Complications: No notable events documented.

## 2023-11-11 NOTE — H&P (Signed)
@LOGO @   Primary Care Physician:  Romeo Rabon, MD Primary Gastroenterologist:  Dr. Jena Gauss  Pre-Procedure History & Physical: HPI:  David Briggs is a 77 y.o. male here for surveillance colonoscopy.  [place]  No GI symptoms at this time.  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   Hyperlipidemia    Nocturia    PONV (postoperative nausea and vomiting)    Prostate cancer (HCC)    recently Dx with Prostate cancer   Prostate cancer (HCC)    Vitamin D deficiency     Past Surgical History:  Procedure Laterality Date   CARDIAC CATHETERIZATION  2021   COLONOSCOPY W/ BIOPSIES AND POLYPECTOMY     HERNIA REPAIR Bilateral    inguinal with mesh   LUMBAR LAMINECTOMY/DECOMPRESSION MICRODISCECTOMY Right 04/05/2014   Procedure: Right Lumbar one-two Microdiskectomy;  Surgeon: Maeola Harman, MD;  Location: [facility];  Service: Neurosurgery;  Laterality: Right;   LYMPHADENECTOMY Bilateral 07/03/2014   Procedure: LYMPHADENECTOMY;  Surgeon: Heloise Purpura, MD;  Location: [facility];  Service: Urology;  Laterality: Bilateral;   ROBOT ASSISTED LAPAROSCOPIC RADICAL PROSTATECTOMY N/A 07/03/2014   Procedure: ROBOTIC ASSISTED LAPAROSCOPIC RADICAL PROSTATECTOMY LEVEL 2;  Surgeon: Heloise Purpura, MD;  Location: [facility];  Service: Urology;  Laterality: N/A;   throat cancer  12/22/2009   radiation x35   TONSILLECTOMY      Prior to Admission medications   Medication Sig Start Date End Date Taking? Authorizing Provider  isosorbide mononitrate (IMDUR) 30 MG 24  hr tablet Take 30 mg by mouth daily. 04/01/23  Yes [provider]  meloxicam (MOBIC) 15 MG tablet Take 15 mg by mouth daily as needed. 02/11/23  Yes [provider]  metoprolol succinate (TOPROL-XL) 25 MG 24 hr tablet Take 25 mg by mouth daily. 01/12/23  Yes [provider]  ranolazine (RANEXA) 1000 MG SR tablet Take 1,000 mg by mouth 2 (two) times daily. 01/12/23  Yes [provider]  Vitamin D, Ergocalciferol, (DRISDOL) 1.25 MG (50000 UNIT) CAPS capsule Take 50,000 Units by mouth every 7 (seven) days. 09/10/23  Yes [provider]  aspirin EC 81 MG tablet Take 81 mg by mouth daily. Swallow whole.    [provider]  atorvastatin (LIPITOR) 80 MG tablet Take 80 mg by mouth at bedtime. 07/08/23   [provider]  nitroGLYCERIN (NITROSTAT) 0.4 MG SL tablet Place 0.4 mg under the tongue every 5 (five) minutes as needed for chest pain.    [provider]  PEG-KCl-NaCl-NaSulf-Na Asc-C (PLENVU) 140 g SOLR Take 1 kit by mouth as directed. 09/30/23   Corbin Ade, MD    Allergies as of 09/30/2023 - Review Complete 09/30/2023  Allergen Reaction Noted   Amoxicillin Nausea And Vomiting 03/22/2014   Versed [midazolam] Hives and Itching 03/22/2014    Family History  Problem Relation Age of Onset   Lung cancer Father    Colon cancer Neg Hx     Social History   Socioeconomic History   Marital status:  Married    Spouse name: Not on file   Number of children: Not on file   Years of education: Not on file   Highest education level: Not on file  Occupational History   Occupation: retired from [employer]  Tobacco Use   Smoking status: Former    Current packs/day: 0.00    Average packs/day: 1 pack/day for 20.0 years (20.0 ttl pk-yrs)    Types: Cigarettes    Start date: 06/29/1974    Quit date: 06/29/1994    Years since quitting: 29.3   Smokeless tobacco: Never  Substance and Sexual Activity   Alcohol use: No   Drug use: No    Sexual activity: Not Currently  Other Topics Concern   Not on file  Social History Narrative   Not on file   Social Determinants of Health   Financial Resource Strain: Not on file  Food Insecurity: Not on file  Transportation Needs: Not on file  Physical Activity: Not on file  Stress: Not on file  Social Connections: Not on file  Intimate Partner Violence: Not on file    Review of Systems: See HPI, otherwise negative ROS  Physical Exam: BP 113/66 (BP Location: Right Arm)   Temp 98.4 F (36.9 C)   Resp 12   SpO2 100%  General:   Alert,  Well-developed, well-nourished, pleasant and cooperative in NAD Lungs:  Clear throughout to auscultation.   No wheezes, crackles, or rhonchi. No acute distress. Heart:  Regular rate and rhythm; no murmurs, clicks, rubs,  or gallops. Abdomen: Non-distended, normal bowel sounds.  Soft and nontender without appreciable mass or hepatosplenomegaly.   Impression/Plan: 77 year old gentleman with multiple colonic adenomas removed over time.  He remains fit for another surveillance examination at this time.  I have offered him a surveillance colonoscopy today per plan. The risks, benefits, limitations, alternatives and imponderables have been reviewed with the patient. Questions have been answered. All parties are agreeable.       Notice: This dictation was prepared with Dragon dictation along with smaller phrase technology. Any transcriptional errors that result from this process are unintentional and may not be corrected upon review.

## 2023-11-12 NOTE — Anesthesia Postprocedure Evaluation (Signed)
Anesthesia Post Note  Patient: David Briggs  Procedure(s) Performed: FLEXIBLE SIGMOIDOSCOPY  Patient location during evaluation: Phase II Anesthesia Type: General Level of consciousness: awake Pain management: pain level controlled Vital Signs Assessment: post-procedure vital signs reviewed and stable Respiratory status: spontaneous breathing and respiratory function stable Cardiovascular status: blood pressure returned to baseline and stable Postop Assessment: no headache and no apparent nausea or vomiting Anesthetic complications: no Comments: Late entry   No notable events documented.   Last Vitals:  Vitals:   11/11/23 1028 11/11/23 1033  BP: (!) 87/60 99/67  Pulse:    Resp:    Temp:    SpO2:      Last Pain:  Vitals:   11/11/23 1022  TempSrc: Oral  PainSc: 0-No pain                 [name]

## 2023-11-17 ENCOUNTER — Encounter (HOSPITAL_COMMUNITY): Payer: Self-pay | Admitting: Internal Medicine

## 2023-11-24 ENCOUNTER — Telehealth: Payer: Self-pay | Admitting: *Deleted

## 2023-11-24 MED ORDER — PEG 3350-KCL-NA BICARB-NACL 420 G PO SOLR: 4000.0000 mL | Freq: Once | ORAL | 0 refills | Status: AC

## 2023-11-24 NOTE — Telephone Encounter (Signed)
Called pt and made aware of pre-op appt details

## 2023-11-24 NOTE — Telephone Encounter (Signed)
Called pt and he is aware of new the pre-op appt time at 1:15pm.

## 2023-11-24 NOTE — Telephone Encounter (Signed)
Spoke with pt. He called in to reschedule his procedure as he did not have a good prep with plenvu in November. Scheduled for 1/8 and advised will send rx for prep to [facility]. Instructions to be mailed and aware to call if he does not receive in the next 2-3 weeks. Confirmed address. Confirmed insurance will not change. Will call back with pre-op appt.

## 2023-12-25 ENCOUNTER — Encounter (HOSPITAL_COMMUNITY)
Admission: RE | Admit: 2023-12-25 | Discharge: 2023-12-25 | Disposition: A | Discharge status: Home / Self Care | Payer: Medicare Other | Source: Ambulatory Visit | Attending: Internal Medicine

## 2023-12-25 ENCOUNTER — Encounter (HOSPITAL_COMMUNITY): Payer: Self-pay

## 2023-12-25 ENCOUNTER — Other Ambulatory Visit (HOSPITAL_COMMUNITY): Payer: Medicare Other

## 2023-12-25 ENCOUNTER — Other Ambulatory Visit: Payer: Self-pay

## 2023-12-30 ENCOUNTER — Ambulatory Visit (HOSPITAL_COMMUNITY): Payer: Medicare Other | Admitting: Anesthesiology

## 2023-12-30 ENCOUNTER — Encounter (HOSPITAL_COMMUNITY)
Admission: RE | Disposition: A | Discharge status: Home / Self Care | Payer: Self-pay | Source: Home / Self Care | Attending: Internal Medicine

## 2023-12-30 ENCOUNTER — Encounter (HOSPITAL_COMMUNITY): Payer: Self-pay | Admitting: Internal Medicine

## 2023-12-30 ENCOUNTER — Ambulatory Visit (HOSPITAL_COMMUNITY)
Admission: RE | Admit: 2023-12-30 | Discharge: 2023-12-30 | Disposition: A | Discharge status: Home / Self Care | Payer: Medicare Other | Attending: Internal Medicine | Admitting: Internal Medicine

## 2023-12-30 DIAGNOSIS — Z87891 Personal history of nicotine dependence: Secondary | ICD-10-CM | POA: Insufficient documentation

## 2023-12-30 DIAGNOSIS — Z1211 Encounter for screening for malignant neoplasm of colon: Secondary | ICD-10-CM | POA: Diagnosis present

## 2023-12-30 DIAGNOSIS — D120 Benign neoplasm of cecum: Secondary | ICD-10-CM | POA: Insufficient documentation

## 2023-12-30 DIAGNOSIS — I10 Essential (primary) hypertension: Secondary | ICD-10-CM | POA: Diagnosis not present

## 2023-12-30 DIAGNOSIS — E119 Type 2 diabetes mellitus without complications: Secondary | ICD-10-CM | POA: Diagnosis not present

## 2023-12-30 DIAGNOSIS — Z860100 Personal history of colon polyps, unspecified: Secondary | ICD-10-CM

## 2023-12-30 DIAGNOSIS — K635 Polyp of colon: Secondary | ICD-10-CM | POA: Diagnosis not present

## 2023-12-30 DIAGNOSIS — I2510 Atherosclerotic heart disease of native coronary artery without angina pectoris: Secondary | ICD-10-CM | POA: Diagnosis not present

## 2023-12-30 HISTORY — PX: POLYPECTOMY: SHX5525

## 2023-12-30 HISTORY — PX: COLONOSCOPY WITH PROPOFOL: SHX5780

## 2023-12-30 LAB — GLUCOSE, CAPILLARY: Glucose-Capillary: 125 mg/dL — ABNORMAL HIGH (ref 70–99)

## 2023-12-30 SURGERY — COLONOSCOPY WITH PROPOFOL
Anesthesia: General

## 2023-12-30 MED ORDER — ONDANSETRON HCL 4 MG/2ML IJ SOLN
INTRAMUSCULAR | Status: AC
  Filled 2023-12-30: qty 2

## 2023-12-30 MED ORDER — PROPOFOL 10 MG/ML IV BOLUS
INTRAVENOUS | Status: DC | PRN
  Administered 2023-12-30 (×2): 20 mg via INTRAVENOUS
  Administered 2023-12-30: 60 mg via INTRAVENOUS

## 2023-12-30 MED ORDER — LACTATED RINGERS IV SOLN: INTRAVENOUS | Status: DC | PRN

## 2023-12-30 MED ORDER — PHENYLEPHRINE 80 MCG/ML (10ML) SYRINGE FOR IV PUSH (FOR BLOOD PRESSURE SUPPORT)
PREFILLED_SYRINGE | INTRAVENOUS | Status: DC | PRN
  Administered 2023-12-30: 160 ug via INTRAVENOUS
  Administered 2023-12-30 (×3): 80 ug via INTRAVENOUS

## 2023-12-30 MED ORDER — PROPOFOL 500 MG/50ML IV EMUL
INTRAVENOUS | Status: DC | PRN
  Administered 2023-12-30: 125 ug/kg/min via INTRAVENOUS

## 2023-12-30 MED ORDER — ONDANSETRON HCL 4 MG/2ML IJ SOLN
INTRAMUSCULAR | Status: DC | PRN
  Administered 2023-12-30: 4 mg via INTRAVENOUS

## 2023-12-30 MED ORDER — LIDOCAINE HCL (PF) 2 % IJ SOLN
INTRAMUSCULAR | Status: AC
  Filled 2023-12-30: qty 5

## 2023-12-30 MED ORDER — LIDOCAINE HCL (CARDIAC) PF 100 MG/5ML IV SOSY
PREFILLED_SYRINGE | INTRAVENOUS | Status: DC | PRN
  Administered 2023-12-30: 60 mg via INTRAVENOUS

## 2023-12-30 NOTE — Anesthesia Preprocedure Evaluation (Signed)
 Anesthesia Evaluation  Patient identified by MRN, date of birth, ID band Patient awake    Reviewed: Allergy & Precautions, H&P , NPO status , Patient's Chart, lab work & pertinent test results, reviewed documented beta blocker date and time   History of Anesthesia Complications (+) PONV and history of anesthetic complications  Airway Mallampati: II  TM Distance: >3 FB Neck ROM: full    Dental no notable dental hx.    Pulmonary neg pulmonary ROS, former smoker   Pulmonary exam normal breath sounds clear to auscultation       Cardiovascular Exercise Tolerance: Good hypertension, + CAD   Rhythm:regular Rate:Normal     Neuro/Psych negative neurological ROS  negative psych ROS   GI/Hepatic negative GI ROS, Neg liver ROS,,,  Endo/Other  diabetes    Renal/GU negative Renal ROS  negative genitourinary   Musculoskeletal   Abdominal   Peds  Hematology negative hematology ROS (+)   Anesthesia Other Findings   Reproductive/Obstetrics negative OB ROS                             Anesthesia Physical Anesthesia Plan  ASA: 3  Anesthesia Plan: General   Post-op Pain Management:    Induction:   PONV Risk Score and Plan: Propofol  infusion  Airway Management Planned:   Additional Equipment:   Intra-op Plan:   Post-operative Plan:   Informed Consent: I have reviewed the patients History and Physical, chart, labs and discussed the procedure including the risks, benefits and alternatives for the proposed anesthesia with the patient or authorized representative who has indicated his/her understanding and acceptance.     Dental Advisory Given  Plan Discussed with: CRNA  Anesthesia Plan Comments:        Anesthesia Quick Evaluation

## 2023-12-30 NOTE — H&P (Signed)
 @LOGO @   Primary Care Physician:  Milana Sharper, MD Primary Gastroenterologist:  Dr. Shaaron  Pre-Procedure History & Physical: HPI:  David Briggs is a 78 y.o. male here for  surveillance colonoscopy.  Reportedly multiple colonic polyps [place]  Last colonoscopy 5 years ago.  He is here for surveillance colonoscopy no bowel symptoms at this time.  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   Hyperlipidemia    Nocturia    PONV (postoperative nausea and vomiting)    Prostate cancer (HCC)    recently Dx with Prostate cancer   Prostate cancer (HCC)    Vitamin D deficiency     Past Surgical History:  Procedure Laterality Date   CARDIAC CATHETERIZATION  2021   COLONOSCOPY W/ BIOPSIES AND POLYPECTOMY     FLEXIBLE SIGMOIDOSCOPY N/A 11/11/2023   Procedure: FLEXIBLE SIGMOIDOSCOPY;  Surgeon: Shaaron Lamar HERO, MD;  Location: [facility];  Service: Endoscopy;  Laterality: N/A;  2:15pm, asa 3   HERNIA REPAIR Bilateral    inguinal with mesh   LUMBAR LAMINECTOMY/DECOMPRESSION MICRODISCECTOMY Right 04/05/2014   Procedure: Right Lumbar one-two Microdiskectomy;  Surgeon: Fairy Levels, MD;  Location: [facility];  Service: Neurosurgery;  Laterality: Right;   LYMPHADENECTOMY Bilateral 07/03/2014   Procedure: LYMPHADENECTOMY;  Surgeon: Gretel Ferrara, MD;  Location: [facility];  Service: Urology;  Laterality: Bilateral;   ROBOT ASSISTED LAPAROSCOPIC RADICAL PROSTATECTOMY N/A 07/03/2014   Procedure: ROBOTIC ASSISTED LAPAROSCOPIC RADICAL PROSTATECTOMY LEVEL 2;   Surgeon: Gretel Ferrara, MD;  Location: [facility];  Service: Urology;  Laterality: N/A;   throat cancer  12/22/2009   radiation x35   TONSILLECTOMY      Prior to Admission medications   Medication Sig Start Date End Date Taking? Authorizing Provider  aspirin EC 81 MG tablet Take 81 mg by mouth daily. Swallow whole.   Yes [provider]  atorvastatin (LIPITOR) 80 MG tablet Take 80 mg by mouth at bedtime. 07/08/23  Yes [provider]  isosorbide mononitrate (IMDUR) 30 MG 24 hr tablet Take 30 mg by mouth daily. 04/01/23  Yes [provider]  meloxicam  (MOBIC ) 15 MG tablet Take 15 mg by mouth daily as needed. 02/11/23  Yes [provider]  metoprolol succinate (TOPROL-XL) 25 MG 24 hr tablet Take 25 mg by mouth daily. 01/12/23  Yes [provider]  PEG-KCl-NaCl-NaSulf-Na Asc-C (PLENVU ) 140 g SOLR Take 1 kit by mouth as directed. 09/30/23  Yes Apolinar Bero, Lamar HERO, MD  ranolazine (RANEXA) 1000 MG SR tablet Take 1,000 mg by mouth 2 (two) times daily. 01/12/23  Yes [provider]  Vitamin D, Ergocalciferol, (DRISDOL) 1.25 MG (50000 UNIT) CAPS capsule Take 50,000 Units by mouth every 7 (seven) days. 09/10/23  Yes [provider]  nitroGLYCERIN (NITROSTAT) 0.4 MG SL tablet Place 0.4 mg under the tongue every 5 (five) minutes as needed for chest pain.    [provider]    Allergies as of 11/24/2023 - Review Complete 11/11/2023  Allergen Reaction Noted   Amoxicillin Nausea And  Vomiting 03/22/2014   Versed  [midazolam ] Hives and Itching 03/22/2014    Family History  Problem Relation Age of Onset   Lung cancer Father    Colon cancer Neg Hx     Social History   Socioeconomic History   Marital status: Married    Spouse name: Not on file   Number of children: Not on file   Years of education: Not on file   Highest education level: Not on file  Occupational History   Occupation: retired from [employer]  Tobacco Use   Smoking status:  Former    Current packs/day: 0.00    Average packs/day: 1 pack/day for 20.0 years (20.0 ttl pk-yrs)    Types: Cigarettes    Start date: 06/29/1974    Quit date: 06/29/1994    Years since quitting: 29.5   Smokeless tobacco: Never  Substance and Sexual Activity   Alcohol use: No   Drug use: No   Sexual activity: Not Currently  Other Topics Concern   Not on file  Social History Narrative   Not on file   Social Drivers of Health   Financial Resource Strain: Not on file  Food Insecurity: Not on file  Transportation Needs: Not on file  Physical Activity: Not on file  Stress: Not on file  Social Connections: Not on file  Intimate Partner Violence: Not on file    Review of Systems: See HPI, otherwise negative ROS  Physical Exam: BP 138/79   Pulse 68   Temp 98.2 F (36.8 C) (Oral)   Resp 11   Ht 5' 10 (1.778 m)   Wt 79.3 kg   SpO2 100%   BMI 25.08 kg/m  General:   Alert,  Well-developed, well-nourished, pleasant and cooperative in NAD Neck:  Supple; no masses or thyromegaly. No significant cervical adenopathy. Lungs:  Clear throughout to auscultation.   No wheezes, crackles, or rhonchi. No acute distress. Heart:  Regular rate and rhythm; no murmurs, clicks, rubs,  or gallops. Abdomen: Non-distended, normal bowel sounds.  Soft and nontender without appreciable mass or hepatosplenomegaly.   Impression/Plan:    78 year old gentleman [place] a [place] for surveillance examination. The risks, benefits, limitations, alternatives and imponderables have been reviewed with the patient. Questions have been answered. All parties are agreeable.      Patient reports 3 polyps removed in [place]  5 years ago.     Notice: This dictation was prepared with Dragon dictation along with smaller phrase technology. Any transcriptional errors that result from this process are unintentional and may not be corrected upon review.

## 2023-12-30 NOTE — Op Note (Signed)
 [facility] Patient Name: David Briggs Procedure Date: 12/30/2023 8:03 AM MRN: [id number] Date of Birth: [birth year] Attending MD: Lamar Ozell Hollingshead , MD, [id number] CSN: [id number] Age: 78 Admit Type: Ambulatory Procedure:                Colonoscopy Indications:              High risk colon cancer surveillance: Personal                            history of colonic polyps Providers:                Lamar Ozell Hollingshead, MD, Jon LABOR. Museum/gallery Exhibitions Officer, CHARITY FUNDRAISER,                            Mickel CROME. Shirlean Balm, Technician Referring MD:              Medicines:                Propofol  per Anesthesia Complications:            No immediate complications. Estimated Blood Loss:     Estimated blood loss was minimal. Procedure:                Pre-Anesthesia Assessment:                           - Prior to the procedure, a History and Physical                            was performed, and patient medications and                            allergies were reviewed. The patient's tolerance of                            previous anesthesia was also reviewed. The risks                            and benefits of the procedure and the sedation                            options and risks were discussed with the patient.                            All questions were answered, and informed consent                            was obtained. Prior Anticoagulants: The patient has                            taken no anticoagulant or antiplatelet agents. ASA                            Grade Assessment: III - A patient with severe  systemic disease. After reviewing the risks and                            benefits, the patient was deemed in satisfactory                            condition to undergo the procedure.                           After obtaining informed consent, the colonoscope                            was passed under direct vision. Throughout the                             procedure, the patient's blood pressure, pulse, and                            oxygen saturations were monitored continuously. The                            [id number] scope was introduced through the                            anus and advanced to the the cecum, identified by                            appendiceal orifice and ileocecal valve. The                            colonoscopy was performed without difficulty. The                            patient tolerated the procedure well. The quality                            of the bowel preparation was adequate. The                            ileocecal valve, appendiceal orifice, and rectum                            were photographed. Scope In: 8:34:47 AM Scope Out: 8:56:37 AM Scope Withdrawal Time: 0 hours 9 minutes 46 seconds  Total Procedure Duration: 0 hours 21 minutes 50 seconds  Findings:      The perianal and digital rectal examinations were normal.      Two sessile polyps were found in the ileocecal valve. The polyps were 5       to 7 mm in size. These polyps were removed with a cold snare. Resection       and retrieval were complete. Estimated blood loss was minimal.      The exam was otherwise without abnormality on direct and retroflexion       views. Impression:               -  Two 5 to 7 mm polyps at the ileocecal valve,                            removed with a cold snare. Resected and retrieved.                           - The examination was otherwise normal on direct                            and retroflexion views. Moderate Sedation:      Moderate (conscious) sedation was personally administered by an       anesthesia professional. The following parameters were monitored: oxygen       saturation, heart rate, blood pressure, respiratory rate, EKG, adequacy       of pulmonary ventilation, and response to care. Recommendation:           - Patient has a contact number available for                             emergencies. The signs and symptoms of potential                            delayed complications were discussed with the                            patient. Return to normal activities tomorrow.                            Written discharge instructions were provided to the                            patient.                           - Advance diet as tolerated.                           - Continue present medications.                           - Repeat colonoscopy date to be determined after                            pending pathology results are reviewed for                            surveillance.                           - Return to GI office (date not yet determined). Procedure Code(s):        --- Professional ---                           [phone], Colonoscopy, flexible; with removal of  tumor(s), polyp(s), or other lesion(s) by snare                            technique Diagnosis Code(s):        --- Professional ---                           Z86.010, Personal history of colonic polyps                           D12.0, Benign neoplasm of cecum CPT copyright 2022 [employer] Association. All rights reserved. The codes documented in this report are preliminary and upon coder review may  be revised to meet current compliance requirements. Lamar HERO. Margerie Fraiser, MD Lamar Ozell Hollingshead, MD 12/30/2023 9:05:47 AM This report has been signed electronically. Number of Addenda: 0

## 2023-12-30 NOTE — Transfer of Care (Addendum)
 Immediate Anesthesia Transfer of Care Note  Patient: David Briggs  Procedure(s) Performed: COLONOSCOPY WITH PROPOFOL  POLYPECTOMY  Patient Location: Short Stay  Anesthesia Type:General  Level of Consciousness: drowsy and patient cooperative  Airway & Oxygen Therapy: Patient Spontanous Breathing and Patient connected to face mask oxygen  Post-op Assessment: Report given to RN and Post -op Vital signs reviewed and stable  Post vital signs: Reviewed and stable  Last Vitals:  Vitals Value Taken Time  BP 90/51 12/29/22   0903  Temp 36.4 12/29/22   0903  Pulse 58 12/29/22   0903  Resp 15 12/29/22   0903  SpO2 100% 12/29/22   0903    Last Pain:  Vitals:   12/30/23 0830  TempSrc:   PainSc: 0-No pain         Complications: No notable events documented.

## 2023-12-30 NOTE — Discharge Instructions (Signed)
  Colonoscopy Discharge Instructions  Read the instructions outlined below and refer to this sheet in the next few weeks. These discharge instructions provide you with general information on caring for yourself after you leave the hospital. Your doctor may also give you specific instructions. While your treatment has been planned according to the most current medical practices available, unavoidable complications occasionally occur. If you have any problems or questions after discharge, call Dr. Shaaron at [phone]. ACTIVITY You may resume your regular activity, but move at a slower pace for the next 24 hours.  Take frequent rest periods for the next 24 hours.  Walking will help get rid of the air and reduce the bloated feeling in your belly (abdomen).  No driving for 24 hours (because of the medicine (anesthesia) used during the test).   Do not sign any important legal documents or operate any machinery for 24 hours (because of the anesthesia used during the test).  NUTRITION Drink plenty of fluids.  You may resume your normal diet as instructed by your doctor.  Begin with a light meal and progress to your normal diet. Heavy or fried foods are harder to digest and may make you feel sick to your stomach (nauseated).  Avoid alcoholic beverages for 24 hours or as instructed.  MEDICATIONS You may resume your normal medications unless your doctor tells you otherwise.  WHAT YOU CAN EXPECT TODAY Some feelings of bloating in the abdomen.  Passage of more gas than usual.  Spotting of blood in your stool or on the toilet paper.  IF YOU HAD POLYPS REMOVED DURING THE COLONOSCOPY: No aspirin products for 7 days or as instructed.  No alcohol for 7 days or as instructed.  Eat a soft diet for the next 24 hours.  FINDING OUT THE RESULTS OF YOUR TEST Not all test results are available during your visit. If your test results are not back during the visit, make an appointment with your caregiver to find out the  results. Do not assume everything is normal if you have not heard from your caregiver or the medical facility. It is important for you to follow up on all of your test results.  SEEK IMMEDIATE MEDICAL ATTENTION IF: You have more than a spotting of blood in your stool.  Your belly is swollen (abdominal distention).  You are nauseated or vomiting.  You have a temperature over 101.  You have abdominal pain or discomfort that is severe or gets worse throughout the day.        Your prep was good today!      2 polyps found and removed      Further recommendations to follow pending review of pathology report       at patient request, called Jori Metro at [phone]-reviewed findings and recommendations

## 2023-12-31 LAB — SURGICAL PATHOLOGY

## 2024-01-01 ENCOUNTER — Encounter (HOSPITAL_COMMUNITY): Payer: Self-pay | Admitting: Internal Medicine

## 2024-01-01 ENCOUNTER — Encounter: Payer: Self-pay | Admitting: Internal Medicine

## 2024-01-01 NOTE — Anesthesia Postprocedure Evaluation (Signed)
 Anesthesia Post Note  Patient: David Briggs  Procedure(s) Performed: COLONOSCOPY WITH PROPOFOL  POLYPECTOMY  Patient location during evaluation: Phase II Anesthesia Type: General Level of consciousness: awake Pain management: pain level controlled Vital Signs Assessment: post-procedure vital signs reviewed and stable Respiratory status: spontaneous breathing and respiratory function stable Cardiovascular status: blood pressure returned to baseline and stable Postop Assessment: no headache and no apparent nausea or vomiting Anesthetic complications: no Comments: Late entry   No notable events documented.   Last Vitals:  Vitals:   12/30/23 0903 12/30/23 0908  BP: (!) 90/51 (!) 98/56  Pulse: (!) 58 62  Resp: 15 15  Temp: 36.4 C   SpO2: 100% 100%    Last Pain:  Vitals:   12/30/23 0908  TempSrc:   PainSc: Asleep                 Yvonna JINNY Bosworth
